# Patient Record
Sex: Female | Born: 1960 | Race: White | Hispanic: No | Marital: Married | Smoking: Current every day smoker
Health system: Southern US, Community
[De-identification: ages and names within clinical notes are randomized; demographics above are authoritative.]

## PROBLEM LIST (undated history)

## (undated) DIAGNOSIS — E079 Disorder of thyroid, unspecified: Secondary | ICD-10-CM

## (undated) DIAGNOSIS — K219 Gastro-esophageal reflux disease without esophagitis: Secondary | ICD-10-CM

## (undated) DIAGNOSIS — T7840XA Allergy, unspecified, initial encounter: Secondary | ICD-10-CM

## (undated) DIAGNOSIS — R011 Cardiac murmur, unspecified: Secondary | ICD-10-CM

## (undated) DIAGNOSIS — E785 Hyperlipidemia, unspecified: Secondary | ICD-10-CM

## (undated) DIAGNOSIS — G4730 Sleep apnea, unspecified: Secondary | ICD-10-CM

## (undated) DIAGNOSIS — M1990 Unspecified osteoarthritis, unspecified site: Secondary | ICD-10-CM

## (undated) DIAGNOSIS — I10 Essential (primary) hypertension: Secondary | ICD-10-CM

## (undated) HISTORY — DX: Hyperlipidemia, unspecified: E78.5

## (undated) HISTORY — DX: Gastro-esophageal reflux disease without esophagitis: K21.9

## (undated) HISTORY — DX: Sleep apnea, unspecified: G47.30

## (undated) HISTORY — DX: Disorder of thyroid, unspecified: E07.9

## (undated) HISTORY — DX: Cardiac murmur, unspecified: R01.1

## (undated) HISTORY — PX: LAPAROTOMY: SHX154

## (undated) HISTORY — DX: Allergy, unspecified, initial encounter: T78.40XA

## (undated) HISTORY — DX: Unspecified osteoarthritis, unspecified site: M19.90

## (undated) HISTORY — PX: NEPHRECTOMY: SHX65

## (undated) HISTORY — DX: Essential (primary) hypertension: I10

---

## 2010-10-29 ENCOUNTER — Other Ambulatory Visit: Payer: Self-pay | Admitting: Orthopedic Surgery

## 2010-10-29 DIAGNOSIS — M19071 Primary osteoarthritis, right ankle and foot: Secondary | ICD-10-CM

## 2010-11-03 ENCOUNTER — Ambulatory Visit
Admission: RE | Admit: 2010-11-03 | Discharge: 2010-11-03 | Disposition: A | Discharge status: Home / Self Care | Payer: PRIVATE HEALTH INSURANCE | Source: Ambulatory Visit | Attending: Orthopedic Surgery | Admitting: Orthopedic Surgery

## 2010-11-03 DIAGNOSIS — M19071 Primary osteoarthritis, right ankle and foot: Secondary | ICD-10-CM

## 2010-11-03 MED ORDER — IOHEXOL 180 MG/ML  SOLN: 1.0000 mL | Freq: Once | INTRAMUSCULAR | Status: AC | PRN

## 2010-11-03 MED ORDER — METHYLPREDNISOLONE ACETATE 40 MG/ML INJ SUSP (RADIOLOG: 120.0000 mg | Freq: Once | INTRAMUSCULAR | Status: DC

## 2015-08-04 DIAGNOSIS — E785 Hyperlipidemia, unspecified: Secondary | ICD-10-CM | POA: Insufficient documentation

## 2015-08-04 DIAGNOSIS — I10 Essential (primary) hypertension: Secondary | ICD-10-CM | POA: Insufficient documentation

## 2016-06-03 ENCOUNTER — Ambulatory Visit (INDEPENDENT_AMBULATORY_CARE_PROVIDER_SITE_OTHER): Payer: 59

## 2016-06-03 ENCOUNTER — Ambulatory Visit (INDEPENDENT_AMBULATORY_CARE_PROVIDER_SITE_OTHER): Payer: 59 | Admitting: Sports Medicine

## 2016-06-03 ENCOUNTER — Encounter: Payer: Self-pay | Admitting: Sports Medicine

## 2016-06-03 DIAGNOSIS — R52 Pain, unspecified: Secondary | ICD-10-CM

## 2016-06-03 DIAGNOSIS — M722 Plantar fascial fibromatosis: Secondary | ICD-10-CM

## 2016-06-03 MED ORDER — MELOXICAM 15 MG PO TABS: 15.0000 mg | ORAL_TABLET | Freq: Every day | ORAL | 0 refills | Status: DC

## 2016-06-03 MED ORDER — TRIAMCINOLONE ACETONIDE 10 MG/ML IJ SUSP: 10.0000 mg | Freq: Once | INTRAMUSCULAR | Status: DC

## 2016-06-03 MED ORDER — METHYLPREDNISOLONE 4 MG PO TBPK: ORAL_TABLET | ORAL | 0 refills | Status: DC

## 2016-06-03 NOTE — Progress Notes (Signed)
Subjective: Sandy Beck is a 56 y.o. female patient presents to office with complaint of heel pain on the left. Patient admits to post static dyskinesia for 3 months in duration. Patient has treated this problem with change of shoes, and Relafen with no relief. Denies any other pedal complaints.   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 Reports that the area just feels very bruised and sore with focal swelling.  There are no active problems to display for this patient.   No current outpatient prescriptions on file prior to visit.   No current facility-administered medications on file prior to visit.     Allergies  Allergen Reactions  . Sulfa Antibiotics     Objective: Physical Exam General: The patient is alert and oriented x3 in no acute distress.  Dermatology: Skin is warm, dry and supple bilateral lower extremities. Patient is very tan from sunbathing. Nails 1-10 are normal. There is no erythema, edema, no eccymosis, no open lesions present. Integument is otherwise unremarkable.  Vascular: Dorsalis Pedis pulse and Posterior Tibial pulse are 1/4 bilateral. Capillary fill time is immediate to all digits.  Neurological: Grossly intact to light touch with an achilles reflex of +2/5 and a  negative Tinel's sign bilateral.  Musculoskeletal: Tenderness to palpation at the medial calcaneal tubercale and through the insertion of the plantar fascia on the left foot. No pain with compression of calcaneus bilateral. No pain with tuning fork to calcaneus bilateral. No pain with calf compression bilateral. There is decreased Ankle joint range of motion bilateral. All other joints range of motion within normal limits bilateral. Strength 5/5 in all groups bilateral.   Gait: Unassisted, Antalgic avoid weight on left heel  Xray, Left foot:  Normal osseous mineralization. Joint spaces preserved. No fracture/dislocation/boney destruction. Calcaneal spur present  with mild thickening of plantar fascia. No other soft tissue abnormalities or radiopaque foreign bodies.   Assessment and Plan: Problem List Items Addressed This Visit    None    Visit Diagnoses    Plantar fasciitis of left foot    -  Primary   Relevant Medications   methylPREDNISolone (MEDROL DOSEPAK) 4 MG TBPK tablet   meloxicam (MOBIC) 15 MG tablet   triamcinolone acetonide (KENALOG) 10 MG/ML injection 10 mg   Pain       Relevant Orders   DG Foot Complete Left      -Complete examination performed.  -Xrays reviewed -Discussed with patient in detail the condition of plantar fasciitis, how this occurs and general treatment options. Explained both conservative and surgical treatments.  -After oral consent and aseptic prep, injected a mixture containing 1 ml of 2%  plain lidocaine, 1 ml 0.5% plain marcaine, 0.5 ml of kenalog 10 and 0.5 ml of dexamethasone phosphate into Left heel. Post-injection care discussed with patient.  -Rx Meloxicam to start after Medrol dose pack is completed -Recommended good supportive shoes -Advised patient to bring with her the custom insoles, She already owns for me to look at for possible modifications  Explained in detail the use of the fascial brace for the left which was dispensed at today's visit. -Explained and dispensed to patient daily stretching exercises. -Recommend patient to ice affected area 1-2x daily. -Patient to return to office in 3-4 weeks for follow up or sooner if problems or questions arise.  Landis Martins, DPM

## 2016-06-03 NOTE — Patient Instructions (Signed)

## 2016-06-25 ENCOUNTER — Ambulatory Visit (INDEPENDENT_AMBULATORY_CARE_PROVIDER_SITE_OTHER): Payer: 59 | Admitting: Sports Medicine

## 2016-06-25 DIAGNOSIS — M722 Plantar fascial fibromatosis: Secondary | ICD-10-CM | POA: Diagnosis not present

## 2016-06-25 DIAGNOSIS — R52 Pain, unspecified: Secondary | ICD-10-CM

## 2016-06-25 MED ORDER — METHYLPREDNISOLONE 4 MG PO TBPK: ORAL_TABLET | ORAL | 0 refills | Status: DC

## 2016-06-25 MED ORDER — MELOXICAM 15 MG PO TABS: 15.0000 mg | ORAL_TABLET | Freq: Every day | ORAL | 0 refills | Status: DC

## 2016-06-25 NOTE — Progress Notes (Signed)
Subjective: Sandy Beck is a 56 y.o. female returns to office for follow up evaluation after Left heel injection for plantar fasciitis, injection #1 administered 3 weeks ago. Patient states that the injection seems to help her pain for a few days and the steriod dose pak for 1 week only. States that the brace helps. Patient denies any recent changes in medications or new problems since last visit.   There are no active problems to display for this patient.   Current Outpatient Prescriptions on File Prior to Visit  Medication Sig Dispense Refill  . ALPRAZolam (XANAX) 0.25 MG tablet Take 0.25 mg by mouth daily.  0  . azithromycin (ZITHROMAX) 250 MG tablet TAKE 2 TABLETS BY MOUTH ON DAY 1, THEN TAKE 1 TABLET DAILY ON DAYS 2-5  0  . etodolac (LODINE) 500 MG tablet etodolac 500 mg tablet  Take 2 tablets every day by oral route for 90 days.    Marland Kitchen etodolac (LODINE) 500 MG tablet     . furosemide (LASIX) 20 MG tablet Take 20 mg by mouth daily.  0  . GUAIATUSSIN AC 100-10 MG/5ML syrup TAKE ONE TEASPOONFUL BY MOUTH TWICE DAILY  0  . levothyroxine (SYNTHROID, LEVOTHROID) 100 MCG tablet levothyroxine 100 mcg tablet  Take 1 tablet every day by oral route for 90 days.    Marland Kitchen levothyroxine (SYNTHROID, LEVOTHROID) 100 MCG tablet     . losartan-hydrochlorothiazide (HYZAAR) 100-25 MG tablet losartan 100 mg-hydrochlorothiazide 25 mg tablet  Take 1 tablet every day by oral route for 90 days.    . pravastatin (PRAVACHOL) 40 MG tablet pravastatin 40 mg tablet  Take 0.5 tablets every day by oral route for 90 days.    . traZODone (DESYREL) 150 MG tablet trazodone 150 mg tablet  Take 1 tablet every day by oral route for 90 days.     Current Facility-Administered Medications on File Prior to Visit  Medication Dose Route Frequency Provider Last Rate Last Dose  . triamcinolone acetonide (KENALOG) 10 MG/ML injection 10 mg  10 mg Other Once Landis Martins, DPM        Allergies  Allergen Reactions  . Sulfa  Antibiotics     Objective:   General:  Alert and oriented x 3, in no acute distress  Dermatology: Skin is warm, dry and supple bilateral lower extremities. Patient is very tan from sunbathing. Nails 1-10 are normal. There is no erythema, edema, no eccymosis, no open lesions present. Integument is otherwise unremarkable.  Vascular: Dorsalis Pedis pulse and Posterior Tibial pulse are 1/4 bilateral. Capillary fill time is immediate to all digits.  Neurological: Grossly intact to light touch with an achilles reflex of +2/5 and a  negative Tinel's sign bilateral.  Musculoskeletal: Tenderness to palpation at the medial calcaneal tubercale and through the insertion of the plantar fascia on the left foot. No pain with compression of calcaneus bilateral. No pain with tuning fork to calcaneus bilateral. No pain with calf compression bilateral. There is decreased Ankle joint range of motion bilateral. All other joints range of motion within normal limits bilateral. Strength 5/5 in all groups bilateral.   Assessment and Plan: Problem List Items Addressed This Visit    None    Visit Diagnoses    Pain    -  Primary   Plantar fasciitis of left foot       Relevant Medications   methylPREDNISolone (MEDROL DOSEPAK) 4 MG TBPK tablet   meloxicam (MOBIC) 15 MG tablet      -Complete examination  performed.  -Previous x-rays reviewed. -Discussed with patient in detail the condition of plantar fasciitis, how this  occurs related to the foot type of the patient and general treatment options. - Patient declined another injection today -Dispensed Left night splint -Refilled Medrol dosepak and mobic to take as instructed  -Continue with fascial brace, stretching, icing, good supportive shoes, OTC inserts daily.  -Discussed long term care and reocurrence; will closely monitor; if fails to improve will consider other treatment modalities.  -Patient to return to office in 3 weeks for follow up or sooner if  problems or questions arise.  Landis Martins, DPM

## 2016-07-23 ENCOUNTER — Ambulatory Visit: Payer: 59 | Admitting: Sports Medicine

## 2020-01-19 DIAGNOSIS — C801 Malignant (primary) neoplasm, unspecified: Secondary | ICD-10-CM

## 2020-01-19 HISTORY — DX: Malignant (primary) neoplasm, unspecified: C80.1

## 2020-04-25 DIAGNOSIS — C642 Malignant neoplasm of left kidney, except renal pelvis: Secondary | ICD-10-CM | POA: Insufficient documentation

## 2020-06-03 ENCOUNTER — Encounter: Payer: Self-pay | Admitting: Nurse Practitioner

## 2020-06-03 ENCOUNTER — Ambulatory Visit (INDEPENDENT_AMBULATORY_CARE_PROVIDER_SITE_OTHER): Payer: PRIVATE HEALTH INSURANCE | Admitting: Nurse Practitioner

## 2020-06-03 ENCOUNTER — Other Ambulatory Visit: Payer: Self-pay

## 2020-06-03 ENCOUNTER — Other Ambulatory Visit: Payer: Self-pay | Admitting: Nurse Practitioner

## 2020-06-03 VITALS — BP 152/88 | HR 77 | Temp 97.4°F | Ht 66.0 in | Wt 203.0 lb

## 2020-06-03 DIAGNOSIS — K219 Gastro-esophageal reflux disease without esophagitis: Secondary | ICD-10-CM

## 2020-06-03 DIAGNOSIS — I10 Essential (primary) hypertension: Secondary | ICD-10-CM

## 2020-06-03 DIAGNOSIS — E782 Mixed hyperlipidemia: Secondary | ICD-10-CM

## 2020-06-03 DIAGNOSIS — Z1231 Encounter for screening mammogram for malignant neoplasm of breast: Secondary | ICD-10-CM

## 2020-06-03 DIAGNOSIS — Z905 Acquired absence of kidney: Secondary | ICD-10-CM | POA: Diagnosis not present

## 2020-06-03 DIAGNOSIS — E039 Hypothyroidism, unspecified: Secondary | ICD-10-CM

## 2020-06-03 DIAGNOSIS — G4700 Insomnia, unspecified: Secondary | ICD-10-CM

## 2020-06-03 DIAGNOSIS — Z7689 Persons encountering health services in other specified circumstances: Secondary | ICD-10-CM

## 2020-06-03 DIAGNOSIS — Z85528 Personal history of other malignant neoplasm of kidney: Secondary | ICD-10-CM

## 2020-06-03 MED ORDER — LEVOTHYROXINE SODIUM 100 MCG PO TABS: 100.0000 ug | ORAL_TABLET | Freq: Every day | ORAL | 0 refills | Status: DC

## 2020-06-03 MED ORDER — PRAVASTATIN SODIUM 40 MG PO TABS: ORAL_TABLET | ORAL | 0 refills | Status: DC

## 2020-06-03 MED ORDER — LOSARTAN POTASSIUM-HCTZ 100-25 MG PO TABS: ORAL_TABLET | ORAL | 0 refills | Status: DC

## 2020-06-03 MED ORDER — TRAZODONE HCL 150 MG PO TABS: ORAL_TABLET | ORAL | 0 refills | Status: DC

## 2020-06-03 MED ORDER — OMEPRAZOLE 40 MG PO CPDR: 40.0000 mg | DELAYED_RELEASE_CAPSULE | Freq: Every day | ORAL | 0 refills | Status: DC

## 2020-06-03 NOTE — Progress Notes (Signed)
New Patient Office Visit  Subjective:  Patient ID: Sandy Beck, female    DOB: July 08, 1960  Age: 60 y.o. MRN: [id number]  CC: S/P  Nephrectomy  HPI Sandy Beck is a 60 year old Caucasian female that presents for her initial visit to the office to establish primary care provider. She has chronic medical conditions that include hypertension, hyperlipidemia, GERD, hypothyroidism, and osteoarthritis. She was recently diagnosed with left renal carcinoma and underwent left nephrectomy on 04/25/20 with Dr Vernard Gambles in [facility]. She states she is doing well post-nephrectomy. States she is experiencing some fatigue but she is stronger every day.  Hypertension Sandy Beck has a past medical history of hypertension for several years. Current treatment includes Hyzaar 100-25 mg daily. BP 152/88 in-office today. She tells me she was instructed to hold antihypertensive medications after experiencing hypotension post left nephrectomy. She denies chest pain, dyspnea, headaches, or dizziness. She consumes a heart healthy diet and is physically active.   Hyperlipidemia Sandy Beck has a past medical history of hyperlipidemia for several years. Current treatment includes Pravastatin 40 mg daily. Last lipid panel data pending receiving medical records from previous provider. Sandy Beck has agreed to return to the office for fasting labs. She denies myalgias or arthralgias related to statin therapy.   GERD Sandy Beck has past medical history of GERD for several years. Current treatment includes Omeprazole 40 mg daily. States symptoms are currently well-controlled. She has a past medical history of gastritis with esophageal narrowing. States she underwent EGD with esophageal dilation with Dr Lyda Jester a few years ago. She denies current difficulty swallowing. She is due for screening colonoscopy.    Hypothyroidism        Sandy Beck has a past medical history of hypothyroidism. Current treatment is Levothyroxine 100 mcg daily.  Well-controlled per last labs on 04/22/20 with        TSH 2.05, Free T4 1.28, and T3 total 99. She denies current hypothyroid symptoms.     Social History   Socioeconomic History  . Marital status: Married    Spouse name: Not on file  . Number of children: Not on file  . Years of education: Not on file  . Highest education level: Not on file  Occupational History  . Not on file  Tobacco Use  . Smoking status: Unknown If Ever Smoked  . Smokeless tobacco: Never Used  Substance and Sexual Activity  . Alcohol use: Not on file  . Drug use: Not on file  . Sexual activity: Not on file  Other Topics Concern  . Not on file  Social History Narrative  . Not on file      ROS Review of Systems  Constitutional: Positive for fatigue. Negative for appetite change and unexpected weight change.  HENT: Negative for congestion, ear pain, rhinorrhea, sinus pressure, sinus pain and tinnitus.   Eyes: Negative for pain.  Respiratory: Negative for cough and shortness of breath.   Cardiovascular: Negative for chest pain, palpitations and leg swelling.  Gastrointestinal: Negative for abdominal pain, constipation, diarrhea, nausea and vomiting.  Endocrine: Negative for cold intolerance, heat intolerance, polydipsia, polyphagia and polyuria.  Genitourinary: Negative for dysuria, frequency and hematuria.  Musculoskeletal: Positive for arthralgias (osteoarthritis). Negative for back pain, joint swelling and myalgias.  Skin: Negative for rash.  Allergic/Immunologic: Positive for environmental allergies.  Neurological: Negative for dizziness and headaches.  Hematological: Negative for adenopathy.  Psychiatric/Behavioral: Positive for sleep disturbance. Negative for decreased concentration. The patient is not nervous/anxious.     Objective:   Today's Vitals:  BP (!) 152/88 (BP Location: Left Arm, Patient Position: Sitting)   Pulse 77   Temp (!) 97.4 F (36.3 C) (Temporal)   Ht 5\' 6"  (1.676 m)   Wt  203 lb (92.1 kg)   SpO2 97%   BMI 32.77 kg/m   Physical Exam Vitals reviewed.  Constitutional:      Appearance: Normal appearance.  HENT:     Head: Normocephalic.     Right Ear: Tympanic membrane normal.     Left Ear: Tympanic membrane normal.     Nose: Nose normal.     Mouth/Throat:     Mouth: Mucous membranes are moist.  Eyes:     Pupils: Pupils are equal, round, and reactive to light.  Cardiovascular:     Rate and Rhythm: Normal rate and regular rhythm.     Pulses: Normal pulses.     Heart sounds: Normal heart sounds.  Pulmonary:     Effort: Pulmonary effort is normal.     Breath sounds: Normal breath sounds.  Abdominal:     General: Bowel sounds are normal.     Palpations: Abdomen is soft.  Musculoskeletal:        General: Normal range of motion.     Cervical back: Neck supple.  Skin:    General: Skin is warm and dry.     Capillary Refill: Capillary refill takes less than 2 seconds.  Neurological:     General: No focal deficit present.     Mental Status: She is alert and oriented to person, place, and time.  Psychiatric:        Mood and Affect: Mood normal.        Behavior: Behavior normal.     Assessment & Plan:   1. Essential (primary) hypertension-not at goal -Continue Hyzaar -Continue heart healthy diet -Continue physical activity  2. Mixed hyperlipidemia-pending labs -Continue Pravastatin 40 mg daily  3. Gastro-esophageal reflux disease without esophagitis-well controlled -Continue Prilosec 40 mg daily  4. S/p nephrectomy -Continue follow-up with Dr Vernard Gambles as scheduled  5. Personal history of renal cancer  6. Encounter for screening mammogram for malignant neoplasm of breast - [facility] DIGITAL SCREENING BILATERAL  Return on June 27th for fasting blood work and mammogram    Outpatient Encounter Medications as of 06/03/2020  Medication Sig  . levothyroxine (SYNTHROID, LEVOTHROID) 100 MCG tablet   . losartan-hydrochlorothiazide (HYZAAR) 100-25 MG  tablet losartan 100 mg-hydrochlorothiazide 25 mg tablet  Take 1 tablet every day by oral route for 90 days.  Marland Kitchen omeprazole (PRILOSEC) 40 MG capsule Take 40 mg by mouth daily.  . pravastatin (PRAVACHOL) 40 MG tablet pravastatin 40 mg tablet  Take 0.5 tablets every day by oral route for 90 days.  . traZODone (DESYREL) 150 MG tablet trazodone 150 mg tablet  Take 1 tablet every day by oral route for 90 days.  . [DISCONTINUED] ALPRAZolam (XANAX) 0.25 MG tablet Take 0.25 mg by mouth daily.  . [DISCONTINUED] azithromycin (ZITHROMAX) 250 MG tablet TAKE 2 TABLETS BY MOUTH ON DAY 1, THEN TAKE 1 TABLET DAILY ON DAYS 2-5  . [DISCONTINUED] etodolac (LODINE) 500 MG tablet etodolac 500 mg tablet  Take 2 tablets every day by oral route for 90 days.  . [DISCONTINUED] etodolac (LODINE) 500 MG tablet   . [DISCONTINUED] furosemide (LASIX) 20 MG tablet Take 20 mg by mouth daily.  . [DISCONTINUED] GUAIATUSSIN AC 100-10 MG/5ML syrup TAKE ONE TEASPOONFUL BY MOUTH TWICE DAILY  . [DISCONTINUED] levothyroxine (SYNTHROID, LEVOTHROID) 100 MCG tablet levothyroxine 100  mcg tablet  Take 1 tablet every day by oral route for 90 days.  . [DISCONTINUED] meloxicam (MOBIC) 15 MG tablet Take 1 tablet (15 mg total) by mouth daily.  . [DISCONTINUED] methylPREDNISolone (MEDROL DOSEPAK) 4 MG TBPK tablet Take as instructed  . [DISCONTINUED] triamcinolone acetonide (KENALOG) 10 MG/ML injection 10 mg    No facility-administered encounter medications on file as of 06/03/2020.    Follow-up: 07/14/20 for fasting labs and mammogram; 57-month follow-up with pap  Signed, Rip Harbour, NP

## 2020-06-03 NOTE — Patient Instructions (Addendum)
Return on June 27th for fasting blood work and mammogram   Preventive Care 62-62 Years Old, Female Preventive care refers to lifestyle choices and visits with your health care provider that can promote health and wellness. This includes:  A yearly physical exam. This is also called an annual wellness visit.  Regular dental and eye exams.  Immunizations.  Screening for certain conditions.  Healthy lifestyle choices, such as: ? Eating a healthy diet. ? Getting regular exercise. ? Not using drugs or products that contain nicotine and tobacco. ? Limiting alcohol use. What can I expect for my preventive care visit? Physical exam Your health care provider will check your:  Height and weight. These may be used to calculate your BMI (body mass index). BMI is a measurement that tells if you are at a healthy weight.  Heart rate and blood pressure.  Body temperature.  Skin for abnormal spots. Counseling Your health care provider may ask you questions about your:  Past medical problems.  Family's medical history.  Alcohol, tobacco, and drug use.  Emotional well-being.  Home life and relationship well-being.  Sexual activity.  Diet, exercise, and sleep habits.  Work and work Statistician.  Access to firearms.  Method of birth control.  Menstrual cycle.  Pregnancy history. What immunizations do I need? Vaccines are usually given at various ages, according to a schedule. Your health care provider will recommend vaccines for you based on your age, medical history, and lifestyle or other factors, such as travel or where you work.   What tests do I need? Blood tests  Lipid and cholesterol levels. These may be checked every 5 years, or more often if you are over 18 years old.  Hepatitis C test.  Hepatitis B test. Screening  Lung cancer screening. You may have this screening every year starting at age 37 if you have a 30-pack-year history of smoking and currently smoke  or have quit within the past 15 years.  Colorectal cancer screening. ? All adults should have this screening starting at age 73 and continuing until age 52. ? Your health care provider may recommend screening at age 17 if you are at increased risk. ? You will have tests every 1-10 years, depending on your results and the type of screening test.  Diabetes screening. ? This is done by checking your blood sugar (glucose) after you have not eaten for a while (fasting). ? You may have this done every 1-3 years.  Mammogram. ? This may be done every 1-2 years. ? Talk with your health care provider about when you should start having regular mammograms. This may depend on whether you have a family history of breast cancer.  BRCA-related cancer screening. This may be done if you have a family history of breast, ovarian, tubal, or peritoneal cancers.  Pelvic exam and Pap test. ? This may be done every 3 years starting at age 53. ? Starting at age 26, this may be done every 5 years if you have a Pap test in combination with an HPV test. Other tests  STD (sexually transmitted disease) testing, if you are at risk.  Bone density scan. This is done to screen for osteoporosis. You may have this scan if you are at high risk for osteoporosis. Talk with your health care provider about your test results, treatment options, and if necessary, the need for more tests. Follow these instructions at home: Eating and drinking  Eat a diet that includes fresh fruits and vegetables, whole grains,  lean protein, and low-fat dairy products.  Take vitamin and mineral supplements as recommended by your health care provider.  Do not drink alcohol if: ? Your health care provider tells you not to drink. ? You are pregnant, may be pregnant, or are planning to become pregnant.  If you drink alcohol: ? Limit how much you have to 0-1 drink a day. ? Be aware of how much alcohol is in your drink. In the [place] one drink  equals one 12 oz bottle of beer (355 mL), one 5 oz glass of wine (148 mL), or one 1 oz glass of hard liquor (44 mL).   Lifestyle  Take daily care of your teeth and gums. Brush your teeth every morning and night with fluoride toothpaste. Floss one time each day.  Stay active. Exercise for at least 30 minutes 5 or more days each week.  Do not use any products that contain nicotine or tobacco, such as cigarettes, e-cigarettes, and chewing tobacco. If you need help quitting, ask your health care provider.  Do not use drugs.  If you are sexually active, practice safe sex. Use a condom or other form of protection to prevent STIs (sexually transmitted infections).  If you do not wish to become pregnant, use a form of birth control. If you plan to become pregnant, see your health care provider for a prepregnancy visit.  If told by your health care provider, take low-dose aspirin daily starting at age 31.  Find healthy ways to cope with stress, such as: ? Meditation, yoga, or listening to music. ? Journaling. ? Talking to a trusted person. ? Spending time with friends and family. Safety  Always wear your seat belt while driving or riding in a vehicle.  Do not drive: ? If you have been drinking alcohol. Do not ride with someone who has been drinking. ? When you are tired or distracted. ? While texting.  Wear a helmet and other protective equipment during sports activities.  If you have firearms in your house, make sure you follow all gun safety procedures. What's next?  Visit your health care provider once a year for an annual wellness visit.  Ask your health care provider how often you should have your eyes and teeth checked.  Stay up to date on all vaccines. This information is not intended to replace advice given to you by your health care provider. Make sure you discuss any questions you have with your health care provider. Document Revised: 10/09/2019 Document Reviewed:  09/15/2017 Elsevier Patient Education  2021 [place] Maintenance, Female Adopting a healthy lifestyle and getting preventive care are important in promoting health and wellness. Ask your health care provider about:  The right schedule for you to have regular tests and exams.  Things you can do on your own to prevent diseases and keep yourself healthy. What should I know about diet, weight, and exercise? Eat a healthy diet  Eat a diet that includes plenty of vegetables, fruits, low-fat dairy products, and lean protein.  Do not eat a lot of foods that are high in solid fats, added sugars, or sodium.   Maintain a healthy weight Body mass index (BMI) is used to identify weight problems. It estimates body fat based on height and weight. Your health care provider can help determine your BMI and help you achieve or maintain a healthy weight. Get regular exercise Get regular exercise. This is one of the most important things you can do for your health. Most  adults should:  Exercise for at least 150 minutes each week. The exercise should increase your heart rate and make you sweat (moderate-intensity exercise).  Do strengthening exercises at least twice a week. This is in addition to the moderate-intensity exercise.  Spend less time sitting. Even light physical activity can be beneficial. Watch cholesterol and blood lipids Have your blood tested for lipids and cholesterol at 62 years of age, then have this test every 5 years. Have your cholesterol levels checked more often if:  Your lipid or cholesterol levels are high.  You are older than 62 years of age.  You are at high risk for heart disease. What should I know about cancer screening? Depending on your health history and family history, you may need to have cancer screening at various ages. This may include screening for:  Breast cancer.  Cervical cancer.  Colorectal cancer.  Skin cancer.  Lung cancer. What should  I know about heart disease, diabetes, and high blood pressure? Blood pressure and heart disease  High blood pressure causes heart disease and increases the risk of stroke. This is more likely to develop in people who have high blood pressure readings, are of African descent, or are overweight.  Have your blood pressure checked: ? Every 3-5 years if you are 39-39 years of age. ? Every year if you are 68 years old or older. Diabetes Have regular diabetes screenings. This checks your fasting blood sugar level. Have the screening done:  Once every three years after age 39 if you are at a normal weight and have a low risk for diabetes.  More often and at a younger age if you are overweight or have a high risk for diabetes. What should I know about preventing infection? Hepatitis B If you have a higher risk for hepatitis B, you should be screened for this virus. Talk with your health care provider to find out if you are at risk for hepatitis B infection. Hepatitis C Testing is recommended for:  Everyone born from [birth year] through 1967.  Anyone with known risk factors for hepatitis C. Sexually transmitted infections (STIs)  Get screened for STIs, including gonorrhea and chlamydia, if: ? You are sexually active and are younger than 62 years of age. ? You are older than 62 years of age and your health care provider tells you that you are at risk for this type of infection. ? Your sexual activity has changed since you were last screened, and you are at increased risk for chlamydia or gonorrhea. Ask your health care provider if you are at risk.  Ask your health care provider about whether you are at high risk for HIV. Your health care provider may recommend a prescription medicine to help prevent HIV infection. If you choose to take medicine to prevent HIV, you should first get tested for HIV. You should then be tested every 3 months for as long as you are taking the medicine. Pregnancy  If you are  about to stop having your period (premenopausal) and you may become pregnant, seek counseling before you get pregnant.  Take 400 to 800 micrograms (mcg) of folic acid every day if you become pregnant.  Ask for birth control (contraception) if you want to prevent pregnancy. Osteoporosis and menopause Osteoporosis is a disease in which the bones lose minerals and strength with aging. This can result in bone fractures. If you are 44 years old or older, or if you are at risk for osteoporosis and fractures, ask your  health care provider if you should:  Be screened for bone loss.  Take a calcium or vitamin D supplement to lower your risk of fractures.  Be given hormone replacement therapy (HRT) to treat symptoms of menopause. Follow these instructions at home: Lifestyle  Do not use any products that contain nicotine or tobacco, such as cigarettes, e-cigarettes, and chewing tobacco. If you need help quitting, ask your health care provider.  Do not use street drugs.  Do not share needles.  Ask your health care provider for help if you need support or information about quitting drugs. Alcohol use  Do not drink alcohol if: ? Your health care provider tells you not to drink. ? You are pregnant, may be pregnant, or are planning to become pregnant.  If you drink alcohol: ? Limit how much you use to 0-1 drink a day. ? Limit intake if you are breastfeeding.  Be aware of how much alcohol is in your drink. In the [place] one drink equals one 12 oz bottle of beer (355 mL), one 5 oz glass of wine (148 mL), or one 1 oz glass of hard liquor (44 mL). General instructions  Schedule regular health, dental, and eye exams.  Stay current with your vaccines.  Tell your health care provider if: ? You often feel depressed. ? You have ever been abused or do not feel safe at home. Summary  Adopting a healthy lifestyle and getting preventive care are important in promoting health and wellness.  Follow  your health care provider's instructions about healthy diet, exercising, and getting tested or screened for diseases.  Follow your health care provider's instructions on monitoring your cholesterol and blood pressure. This information is not intended to replace advice given to you by your health care provider. Make sure you discuss any questions you have with your health care provider. Document Revised: 12/28/2017 Document Reviewed: 12/28/2017 Elsevier Patient Education  2021 [employer].

## 2020-08-10 ENCOUNTER — Other Ambulatory Visit: Payer: Self-pay | Admitting: Nurse Practitioner

## 2020-08-10 DIAGNOSIS — G4700 Insomnia, unspecified: Secondary | ICD-10-CM

## 2020-08-10 DIAGNOSIS — K219 Gastro-esophageal reflux disease without esophagitis: Secondary | ICD-10-CM

## 2020-08-10 DIAGNOSIS — I10 Essential (primary) hypertension: Secondary | ICD-10-CM

## 2020-08-10 DIAGNOSIS — E039 Hypothyroidism, unspecified: Secondary | ICD-10-CM

## 2020-09-04 ENCOUNTER — Ambulatory Visit (INDEPENDENT_AMBULATORY_CARE_PROVIDER_SITE_OTHER): Payer: PRIVATE HEALTH INSURANCE | Admitting: Nurse Practitioner

## 2020-09-04 ENCOUNTER — Other Ambulatory Visit: Payer: Self-pay | Admitting: Nurse Practitioner

## 2020-09-04 ENCOUNTER — Other Ambulatory Visit: Payer: Self-pay

## 2020-09-04 ENCOUNTER — Encounter: Payer: Self-pay | Admitting: Nurse Practitioner

## 2020-09-04 VITALS — BP 140/78 | HR 66 | Temp 97.9°F | Ht 66.0 in | Wt 203.0 lb

## 2020-09-04 DIAGNOSIS — G4700 Insomnia, unspecified: Secondary | ICD-10-CM

## 2020-09-04 DIAGNOSIS — E782 Mixed hyperlipidemia: Secondary | ICD-10-CM

## 2020-09-04 DIAGNOSIS — K219 Gastro-esophageal reflux disease without esophagitis: Secondary | ICD-10-CM

## 2020-09-04 DIAGNOSIS — I10 Essential (primary) hypertension: Secondary | ICD-10-CM

## 2020-09-04 DIAGNOSIS — Z1231 Encounter for screening mammogram for malignant neoplasm of breast: Secondary | ICD-10-CM

## 2020-09-04 DIAGNOSIS — E039 Hypothyroidism, unspecified: Secondary | ICD-10-CM

## 2020-09-04 DIAGNOSIS — B3731 Acute candidiasis of vulva and vagina: Secondary | ICD-10-CM

## 2020-09-04 DIAGNOSIS — E6609 Other obesity due to excess calories: Secondary | ICD-10-CM

## 2020-09-04 DIAGNOSIS — R5383 Other fatigue: Secondary | ICD-10-CM

## 2020-09-04 DIAGNOSIS — B373 Candidiasis of vulva and vagina: Secondary | ICD-10-CM

## 2020-09-04 DIAGNOSIS — F17218 Nicotine dependence, cigarettes, with other nicotine-induced disorders: Secondary | ICD-10-CM

## 2020-09-04 DIAGNOSIS — Z6832 Body mass index (BMI) 32.0-32.9, adult: Secondary | ICD-10-CM

## 2020-09-04 MED ORDER — LEVOTHYROXINE SODIUM 100 MCG PO TABS: 100.0000 ug | ORAL_TABLET | Freq: Every day | ORAL | 3 refills | Status: DC

## 2020-09-04 MED ORDER — FLUCONAZOLE 150 MG PO TABS: 150.0000 mg | ORAL_TABLET | Freq: Once | ORAL | 0 refills | Status: AC

## 2020-09-04 MED ORDER — LOSARTAN POTASSIUM-HCTZ 100-25 MG PO TABS: ORAL_TABLET | ORAL | 3 refills | Status: DC

## 2020-09-04 MED ORDER — PRAVASTATIN SODIUM 40 MG PO TABS: ORAL_TABLET | ORAL | 0 refills | Status: DC

## 2020-09-04 MED ORDER — OMEPRAZOLE 40 MG PO CPDR: 40.0000 mg | DELAYED_RELEASE_CAPSULE | Freq: Every day | ORAL | 3 refills | Status: DC

## 2020-09-04 MED ORDER — TRAZODONE HCL 150 MG PO TABS: ORAL_TABLET | ORAL | 3 refills | Status: DC

## 2020-09-04 NOTE — Progress Notes (Signed)
Subjective:  Patient ID: Sandy Beck, female    DOB: 1960-10-03  Age: 60 y.o. MRN: [id number]  Chief Complaint  Patient presents with   Hypertension   Hyperlipidemia   Hypothyroidism    HPI: Sandy Beck is a 60 year old Caucasian female that presents for follow-up of hypertension, hyperlipidemia, and hypothyroidism. She tells me she was recently treated for a UTI with a course of antibiotics and has developed a vaginal yeast infection. Sandy Beck inadvertently missed scheduled mammogram due to her father having a CVA. She has requested to reschedule for Sept. She is looking forward to a long-distance drive to [place] to meet her father-in-law next week.  She was last seen for hypertension 3 months ago.  BP at that visit was 152/88. Management since that visit includes Hyzaar 100-25.  She reports good compliance with treatment. She is not having side effects.  She is following a Regular diet. She is not exercising. She does smoke.  Use of agents associated with hypertension: thyroid hormones.   Outside blood pressures are not being checked. Symptoms: No chest pain No chest pressure  No palpitations No syncope  No dyspnea No orthopnea  No paroxysmal nocturnal dyspnea No lower extremity edema   Pertinent labs: Lab Results  Component Value Date   CHOL 187 09/04/2020   HDL 44 09/04/2020   LDLCALC 105 (H) 09/04/2020   TRIG 222 (H) 09/04/2020   CHOLHDL 4.3 09/04/2020   Lab Results  Component Value Date   NA 138 09/04/2020   K 4.2 09/04/2020   CREATININE 1.26 (H) 09/04/2020   GLUCOSE 122 (H) 09/04/2020     The 10-year ASCVD risk score Mikey Bussing DC Jr., et al., 2013) is: 12.8%    Lipid/Cholesterol, Follow-up  Last lipid panel Other pertinent labs  Lab Results  Component Value Date   CHOL 187 09/04/2020   HDL 44 09/04/2020   LDLCALC 105 (H) 09/04/2020   TRIG 222 (H) 09/04/2020   CHOLHDL 4.3 09/04/2020   Lab Results  Component Value Date   ALT 13 09/04/2020   AST 19  09/04/2020   PLT 270 09/04/2020   TSH 2.510 09/04/2020     She was last seen for this 3 months ago.  Management since that visit includes Pravachol 40 mg daily.  She reports good compliance with treatment. She is not having side effects.   Symptoms: No chest pain No chest pressure/discomfort  No dyspnea No lower extremity edema  No numbness or tingling of extremity No orthopnea  No palpitations No paroxysmal nocturnal dyspnea  No speech difficulty No syncope   Current diet: well balanced Current exercise: none  The 10-year ASCVD risk score Mikey Bussing DC Jr., et al., 2013) is: 12.8%   GERD, Follow up:  The patient was last seen for GERD 3 months ago. Current treatment Omeprazole 40 mg daily.  She reports excellent compliance with treatment. She is not having side effects. . She is NOT experiencing belching and eructation   Hypothyroidism, follow-up: Sandy Beck has a past medical history of hypothyroidism for several years. Current treatment is Levothyroxine 100 mcg daily. Last TSH stable at 2.05 on 04/22/20. States she is experiencing fatigue.      Current Outpatient Medications on File Prior to Visit  Medication Sig Dispense Refill   levothyroxine (SYNTHROID) 100 MCG tablet TAKE 1 TABLET BY MOUTH  DAILY BEFORE BREAKFAST 90 tablet 3   losartan-hydrochlorothiazide (HYZAAR) 100-25 MG tablet TAKE 1 TABLET BY MOUTH  DAILY 90 tablet 3   omeprazole (PRILOSEC) 40 MG  capsule TAKE 1 CAPSULE BY MOUTH  DAILY 90 capsule 3   pravastatin (PRAVACHOL) 40 MG tablet pravastatin 40 mg tablet  Take 0.5 tablets every day by oral route for 90 days. 90 tablet 0   traZODone (DESYREL) 150 MG tablet TAKE 1 TABLET BY MOUTH  DAILY 90 tablet 3   No current facility-administered medications on file prior to visit.   Past Medical History:  Diagnosis Date   Allergy    Arthritis    Cancer ([place]) 2022   left renal cancer, had left nephrectomy   GERD (gastroesophageal reflux disease)    Heart murmur     Hyperlipidemia    Hypertension    Sleep apnea    not currently using cpap   Thyroid disease    Past Surgical History:  Procedure Laterality Date   LAPAROTOMY  1980s   tubal pregnancy, left fallopian tube removal   NEPHRECTOMY Left     Family History  Problem Relation Age of Onset   Alzheimer's disease Mother    Diabetes Mellitus II Mother    Hypertension Father    Stroke Sister    Hypertension Sister    Social History   Socioeconomic History   Marital status: Married    Spouse name: Not on file   Number of children: 1   Years of education: Not on file   Highest education level: Not on file  Occupational History   Occupation: Scientist, water quality  Tobacco Use   Smoking status: Every Day    Packs/day: 0.50    Years: 45.00    Pack years: 22.50    Types: Cigarettes   Smokeless tobacco: Never  Vaping Use   Vaping Use: Never used  Substance and Sexual Activity   Alcohol use: Not Currently   Drug use: Never   Sexual activity: Not on file  Other Topics Concern   Not on file  Social History Narrative   Not on file   Social Determinants of Health   Financial Resource Strain: Not on file  Food Insecurity: Not on file  Transportation Needs: Not on file  Physical Activity: Not on file  Stress: Not on file  Social Connections: Not on file    Review of Systems  Constitutional:  Negative for appetite change, fatigue and fever.  HENT:  Negative for congestion, ear pain, sinus pressure and sore throat.   Eyes:  Negative for pain.  Respiratory:  Negative for cough, chest tightness, shortness of breath and wheezing.   Cardiovascular:  Negative for chest pain and palpitations.  Gastrointestinal:  Negative for abdominal pain, constipation, diarrhea, nausea and vomiting.  Endocrine: Negative.   Genitourinary:  Positive for vaginal discharge. Negative for dysuria and hematuria.  Musculoskeletal:  Positive for arthralgias (chronic right foot pain). Negative for back pain, joint swelling  and myalgias.  Skin:  Negative for rash.  Allergic/Immunologic: Negative.   Neurological:  Negative for dizziness, weakness and headaches.  Hematological: Negative.   Psychiatric/Behavioral:  Negative for dysphoric mood. The patient is not nervous/anxious.     Objective:  BP 140/78 (BP Location: Left Arm, Patient Position: Sitting)   Pulse 66   Temp 97.9 F (36.6 C) (Temporal)   Ht '5\' 6"'$  (1.676 m)   Wt 203 lb (92.1 kg)   SpO2 95%   BMI 32.77 kg/m   BP/Weight 09/04/2020 [id number]  Systolic BP [id number]  Diastolic BP 78 88  Wt. (Lbs) 203 203  BMI 32.77 32.77    Physical Exam Vitals reviewed.  Constitutional:  Appearance: Normal appearance.  HENT:     Right Ear: Tympanic membrane, ear canal and external ear normal.     Left Ear: Tympanic membrane, ear canal and external ear normal.     Nose: Nose normal.     Mouth/Throat:     Mouth: Mucous membranes are moist.  Cardiovascular:     Rate and Rhythm: Normal rate and regular rhythm.     Pulses: Normal pulses.     Heart sounds: Normal heart sounds.  Pulmonary:     Effort: Pulmonary effort is normal.     Breath sounds: Normal breath sounds.  Abdominal:     Palpations: Abdomen is soft.  Musculoskeletal:        General: Normal range of motion.     Cervical back: Normal range of motion.  Skin:    General: Skin is warm and dry.  Neurological:     Mental Status: She is alert and oriented to person, place, and time.  Psychiatric:        Mood and Affect: Mood normal.        Behavior: Behavior normal.        Thought Content: Thought content normal.        Judgment: Judgment normal.            Assessment & Plan:    1. Essential (primary) hypertension-not at goal  - CBC With Diff/Platelet - Comprehensive metabolic panel -Continue Hyzaar 100-25 daily  -Heart healthy diet -Increase physical activity  2. Mixed hyperlipidemia-not at goal - Lipid panel -Continue Pravastatin 40 mg daily  3. Acquired  hypothyroidism-well controlled - TSH -Continue Levothyroxine 100 mcg daily  4. Gastro-esophageal reflux disease without esophagitis-well controlled -Continue Prilosec 40 mg daily -Avoid foods that trigger GERD  5. Other fatigue - Vitamin D, 25-hydroxy  6. Vaginal yeast infection - fluconazole (DIFLUCAN) 150 MG tablet; Take 1 tablet (150 mg total) by mouth once for 1 dose.  Dispense: 2 tablet; Refill: 0   We will call you with lab results and mammogram appointment for Monday, September 26th, 2022 Recommend routine eye exam Continue medications Continue heart healthy diet Continue physical activity Notify office if you want a steroid injection to right foot Follow-up in 60-month, fasting    Follow-up: 656-month An After Visit Summary was printed and given to the patient.   I, ShRip HarbourNP, have reviewed all documentation for this visit. The documentation on 09/05/20 for the exam, diagnosis, procedures, and orders are all accurate and complete.    I,Lauren M Auman,acting as a scEducation administratoror ShCIT GroupNP.,have documented all relevant documentation on the behalf of ShRip HarbourNP,as directed by  ShRip HarbourNP while in the presence of ShRip HarbourNP.   Signed, ShRip HarbourNP [phone]

## 2020-09-04 NOTE — Patient Instructions (Addendum)
We will call you with lab results and mammogram appointment for Monday, September 26th, 2022 Recommend routine eye exam Continue medications Continue heart healthy diet Continue physical activity Notify office if you want a steroid injection to right foot Follow-up in 13-month, fasting   Foot Pain Many things can cause foot pain. Some common causes are: An injury. A sprain. Arthritis. Blisters. Bunions. Follow these instructions at home: Managing pain, stiffness, and swelling If directed, put ice on the painful area: Put ice in a plastic bag. Place a towel between your skin and the bag. Leave the ice on for 20 minutes, 2-3 times a day.  Activity Do not stand or walk for long periods. Return to your normal activities as told by your health care provider. Ask your health care provider what activities are safe for you. Do stretches to relieve foot pain and stiffness as told by your health care provider. Do not lift anything that is heavier than 10 lb (4.5 kg), or the limit that you are told, until your health care provider says that it is safe. Lifting a lot of weight can put added pressure on your feet. Lifestyle Wear comfortable, supportive shoes that fit you well. Do not wear high heels. Keep your feet clean and dry. General instructions Take over-the-counter and prescription medicines only as told by your health care provider. Rub your foot gently. Pay attention to any changes in your symptoms. Keep all follow-up visits as told by your health care provider. This is important. Contact a health care provider if: Your pain does not get better after a few days of self-care. Your pain gets worse. You cannot stand on your foot. Get help right away if: Your foot is numb or tingling. Your foot or toes are swollen. Your foot or toes turn white or blue. You have warmth and redness along your foot. Summary Common causes of foot pain are injury, sprain, arthritis, blisters or  bunions. Ice, medicines, and comfortable shoes may help foot pain. Contact your health care provider if your pain does not get better after a few days of self-care. This information is not intended to replace advice given to you by your health care provider. Make sure you discuss any questions you have with your healthcare provider. Document Revised: 10/20/2017 Document Reviewed: 10/20/2017 Elsevier Patient Education  2022 [employer]   Preventive Care [id number] Old, Female Preventive care refers to lifestyle choices and visits with your health care provider that can promote health and wellness. This includes: A yearly physical exam. This is also called an annual wellness visit. Regular dental and eye exams. Immunizations. Screening for certain conditions. Healthy lifestyle choices, such as: Eating a healthy diet. Getting regular exercise. Not using drugs or products that contain nicotine and tobacco. Limiting alcohol use. What can I expect for my preventive care visit? Physical exam Your health care provider will check your: Height and weight. These may be used to calculate your BMI (body mass index). BMI is a measurement that tells if you are at a healthy weight. Heart rate and blood pressure. Body temperature. Skin for abnormal spots. Counseling Your health care provider may ask you questions about your: Past medical problems. Family's medical history. Alcohol, tobacco, and drug use. Emotional well-being. Home life and relationship well-being. Sexual activity. Diet, exercise, and sleep habits. Work and work [employer] Access to firearms. Method of birth control. Menstrual cycle. Pregnancy history. What immunizations do I need?  Vaccines are usually given at various ages, according to a  schedule. Your health care provider will recommend vaccines for you based on your age, medicalhistory, and lifestyle or other factors, such as travel or where you work. What tests  do I need? Blood tests Lipid and cholesterol levels. These may be checked every 5 years, or more often if you are over 62 years old. Hepatitis C test. Hepatitis B test. Screening Lung cancer screening. You may have this screening every year starting at age 62 if you have a 30-pack-year history of smoking and currently smoke or have quit within the past 15 years. Colorectal cancer screening. All adults should have this screening starting at age 62 and continuing until age 50. Your health care provider may recommend screening at age 4 if you are at increased risk. You will have tests every 1-10 years, depending on your results and the type of screening test. Diabetes screening. This is done by checking your blood sugar (glucose) after you have not eaten for a while (fasting). You may have this done every 1-3 years. Mammogram. This may be done every 1-2 years. Talk with your health care provider about when you should start having regular mammograms. This may depend on whether you have a family history of breast cancer. BRCA-related cancer screening. This may be done if you have a family history of breast, ovarian, tubal, or peritoneal cancers. Pelvic exam and Pap test. This may be done every 3 years starting at age 62. Starting at age 62, this may be done every 5 years if you have a Pap test in combination with an HPV test. Other tests STD (sexually transmitted disease) testing, if you are at risk. Bone density scan. This is done to screen for osteoporosis. You may have this scan if you are at high risk for osteoporosis. Talk with your health care provider about your test results, treatment options,and if necessary, the need for more tests. Follow these instructions at home: Eating and drinking  Eat a diet that includes fresh fruits and vegetables, whole grains, lean protein, and low-fat dairy products. Take vitamin and mineral supplements as recommended by your health care provider. Do  not drink alcohol if: Your health care provider tells you not to drink. You are pregnant, may be pregnant, or are planning to become pregnant. If you drink alcohol: Limit how much you have to 0-1 drink a day. Be aware of how much alcohol is in your drink. In the [place] one drink equals one 12 oz bottle of beer (355 mL), one 5 oz glass of wine (148 mL), or one 1 oz glass of hard liquor (44 mL).  Lifestyle Take daily care of your teeth and gums. Brush your teeth every morning and night with fluoride toothpaste. Floss one time each day. Stay active. Exercise for at least 30 minutes 5 or more days each week. Do not use any products that contain nicotine or tobacco, such as cigarettes, e-cigarettes, and chewing tobacco. If you need help quitting, ask your health care provider. Do not use drugs. If you are sexually active, practice safe sex. Use a condom or other form of protection to prevent STIs (sexually transmitted infections). If you do not wish to become pregnant, use a form of birth control. If you plan to become pregnant, see your health care provider for a prepregnancy visit. If told by your health care provider, take low-dose aspirin daily starting at age 54. Find healthy ways to cope with stress, such as: Meditation, yoga, or listening to music. Journaling. Talking to a trusted  person. Spending time with friends and family. Safety Always wear your seat belt while driving or riding in a vehicle. Do not drive: If you have been drinking alcohol. Do not ride with someone who has been drinking. When you are tired or distracted. While texting. Wear a helmet and other protective equipment during sports activities. If you have firearms in your house, make sure you follow all gun safety procedures. What's next? Visit your health care provider once a year for an annual wellness visit. Ask your health care provider how often you should have your eyes and teeth checked. Stay up to date on all  vaccines. This information is not intended to replace advice given to you by your health care provider. Make sure you discuss any questions you have with your healthcare provider. Document Revised: 10/09/2019 Document Reviewed: 09/15/2017 Elsevier Patient Education  2022 [employer].

## 2020-09-05 ENCOUNTER — Other Ambulatory Visit: Payer: Self-pay | Admitting: Nurse Practitioner

## 2020-09-05 ENCOUNTER — Encounter: Payer: Self-pay | Admitting: Nurse Practitioner

## 2020-09-05 DIAGNOSIS — I10 Essential (primary) hypertension: Secondary | ICD-10-CM

## 2020-09-05 LAB — LIPID PANEL
Chol/HDL Ratio: 4.3 ratio (ref 0.0–4.4)
Cholesterol, Total: 187 mg/dL (ref 100–199)
HDL: 44 mg/dL (ref >39)
LDL Chol Calc (NIH): 105 mg/dL — ABNORMAL HIGH (ref 0–99)
Triglycerides: 222 mg/dL — ABNORMAL HIGH (ref 0–149)
VLDL Cholesterol Cal: 38 mg/dL (ref 5–40)

## 2020-09-05 LAB — CBC WITH DIFF/PLATELET
Basophils Absolute: 0.1 10*3/uL (ref 0.0–0.2)
Basos: 1 %
EOS (ABSOLUTE): 0.2 10*3/uL (ref 0.0–0.4)
Eos: 4 %
Hematocrit: 38.2 % (ref 34.0–46.6)
Hemoglobin: 12.9 g/dL (ref 11.1–15.9)
Immature Grans (Abs): 0 10*3/uL (ref 0.0–0.1)
Immature Granulocytes: 0 %
Lymphocytes Absolute: 2.1 10*3/uL (ref 0.7–3.1)
Lymphs: 38 %
MCH: 31.7 pg (ref 26.6–33.0)
MCHC: 33.8 g/dL (ref 31.5–35.7)
MCV: 94 fL (ref 79–97)
Monocytes Absolute: 0.4 10*3/uL (ref 0.1–0.9)
Monocytes: 7 %
Neutrophils Absolute: 2.7 10*3/uL (ref 1.4–7.0)
Neutrophils: 50 %
Platelets: 270 10*3/uL (ref 150–450)
RBC: 4.07 x10E6/uL (ref 3.77–5.28)
RDW: 12.4 % (ref 11.7–15.4)
WBC: 5.4 10*3/uL (ref 3.4–10.8)

## 2020-09-05 LAB — COMPREHENSIVE METABOLIC PANEL
ALT: 13 IU/L (ref 0–32)
AST: 19 IU/L (ref 0–40)
Albumin/Globulin Ratio: 2 (ref 1.2–2.2)
Albumin: 4.4 g/dL (ref 3.8–4.8)
Alkaline Phosphatase: 100 IU/L (ref 44–121)
BUN/Creatinine Ratio: 13 (ref 12–28)
BUN: 17 mg/dL (ref 8–27)
Bilirubin Total: 0.2 mg/dL (ref 0.0–1.2)
CO2: 23 mmol/L (ref 20–29)
Calcium: 9.6 mg/dL (ref 8.7–10.3)
Chloride: 102 mmol/L (ref 96–106)
Creatinine, Ser: 1.26 mg/dL — ABNORMAL HIGH (ref 0.57–1.00)
Globulin, Total: 2.2 g/dL (ref 1.5–4.5)
Glucose: 122 mg/dL — ABNORMAL HIGH (ref 65–99)
Potassium: 4.2 mmol/L (ref 3.5–5.2)
Sodium: 138 mmol/L (ref 134–144)
Total Protein: 6.6 g/dL (ref 6.0–8.5)
eGFR: 49 mL/min/{1.73_m2} — ABNORMAL LOW (ref >59)

## 2020-09-05 LAB — TSH: TSH: 2.51 u[IU]/mL (ref 0.450–4.500)

## 2020-09-05 LAB — CARDIOVASCULAR RISK ASSESSMENT

## 2020-09-05 MED ORDER — AMLODIPINE BESYLATE 5 MG PO TABS: 5.0000 mg | ORAL_TABLET | Freq: Every day | ORAL | 1 refills | Status: DC

## 2020-09-08 LAB — SPECIMEN STATUS REPORT

## 2020-09-08 LAB — HGB A1C W/O EAG: Hgb A1c MFr Bld: 5.7 % — ABNORMAL HIGH (ref 4.8–5.6)

## 2020-09-08 NOTE — Progress Notes (Signed)
Left message to c/b.

## 2020-09-09 ENCOUNTER — Other Ambulatory Visit: Payer: Self-pay

## 2020-09-09 DIAGNOSIS — I10 Essential (primary) hypertension: Secondary | ICD-10-CM

## 2020-09-09 MED ORDER — AMLODIPINE BESYLATE 5 MG PO TABS: 5.0000 mg | ORAL_TABLET | Freq: Every day | ORAL | 0 refills | Status: DC

## 2020-09-11 ENCOUNTER — Other Ambulatory Visit: Payer: Self-pay

## 2020-09-11 DIAGNOSIS — I10 Essential (primary) hypertension: Secondary | ICD-10-CM

## 2020-09-11 MED ORDER — AMLODIPINE BESYLATE 5 MG PO TABS: 5.0000 mg | ORAL_TABLET | Freq: Every day | ORAL | 0 refills | Status: DC

## 2020-10-01 ENCOUNTER — Other Ambulatory Visit: Payer: Self-pay

## 2020-10-01 ENCOUNTER — Encounter: Payer: Self-pay | Admitting: Nurse Practitioner

## 2020-10-01 ENCOUNTER — Ambulatory Visit (INDEPENDENT_AMBULATORY_CARE_PROVIDER_SITE_OTHER): Payer: PRIVATE HEALTH INSURANCE | Admitting: Nurse Practitioner

## 2020-10-01 VITALS — BP 138/78 | HR 67 | Temp 97.4°F | Ht 66.0 in | Wt 203.0 lb

## 2020-10-01 DIAGNOSIS — N952 Postmenopausal atrophic vaginitis: Secondary | ICD-10-CM | POA: Diagnosis not present

## 2020-10-01 DIAGNOSIS — I10 Essential (primary) hypertension: Secondary | ICD-10-CM

## 2020-10-01 DIAGNOSIS — F411 Generalized anxiety disorder: Secondary | ICD-10-CM

## 2020-10-01 MED ORDER — ESTRADIOL 0.1 MG/GM VA CREA: TOPICAL_CREAM | VAGINAL | 12 refills | Status: DC

## 2020-10-01 MED ORDER — BUSPIRONE HCL 10 MG PO TABS: 10.0000 mg | ORAL_TABLET | Freq: Three times a day (TID) | ORAL | 0 refills | Status: DC

## 2020-10-01 NOTE — Patient Instructions (Addendum)
Begin Buspar 10 mg three times daily,notify office if you experience any side effects immediately Begin Estrace cream to vaginal area nightly for two weeks, then twice weekly Follow-up 4 weeks Menopause and Hormone Replacement Therapy Menopause is a normal time of life when menstrual periods stop completely and the ovaries stop producing the female hormones estrogen and progesterone. Low levels of these hormones can affect your health and cause symptoms. Hormone replacement therapy (HRT) can relieve some of those symptoms. HRT is the use of artificial (synthetic) hormones to replace hormones that your body has stopped producing because you have reached menopause. Types of HRT HRT may consist of the synthetic hormones estrogen and progestin, or it may consist of estrogen-only therapy. You and your health care provider will decide which form of HRT is best for you. If you choose to be on HRT and you have a uterus, estrogen and progestin are usually prescribed. Estrogen-only therapy is used for women who do not have a uterus. Possible options for taking HRT include: Pills. Patches. Gels. Sprays. Vaginal cream. Vaginal rings. Vaginal inserts. The amount of hormones that you take and how long you take them varies according to your health. It is important to: Begin HRT with the lowest possible dosage. Stop HRT as soon as your health care provider tells you to stop. Work with your health care provider so that you feel informed and comfortable with your decisions. Tell a health care provider about: Any allergies you have. Whether you have had blood clots or know of any risk factors you may have for blood clots. Whether you or family members have had cancer, especially cancer of the breasts, ovaries, or uterus. Any surgeries you have had. All medicines you are taking, including vitamins, herbs, eye drops, creams, and over-the-counter medicines. Whether you are pregnant or may be pregnant. Any  medical conditions you have. What are the benefits? HRT can reduce the frequency and severity of menopausal symptoms. Benefits of HRT vary according to the kind of symptoms that you have, how severe they are, and your overall health. HRT may help to improve the following symptoms of menopause: Hot flashes and night sweats. These are sudden feelings of heat that spread over the face and body. The skin may turn red, like a blush. Night sweats are hot flashes that happen while you are sleeping or trying to sleep. Bone loss (osteoporosis). The body loses calcium more quickly after menopause, causing the bones to become weaker. This can increase the risk for bone breaks (fractures). Vaginal dryness. The lining of the vagina can become thin and dry, which can cause pain during sex or cause infection, burning, or itching. Urinary tract infections. Urinary incontinence. This is the inability to control when you urinate. Irritability. Short-term memory problems. What are the risks? Risks of HRT vary depending on your individual health and medical history. Risks of HRT also depend on whether you receive both estrogen and progestin or you receive estrogen only. HRT may increase the risk of: Spotting. This is when a small amount of blood leaks from the vagina unexpectedly. Endometrial cancer. This cancer is in the lining of the uterus (endometrium). Breast cancer. Increased density of breast tissue. This can make it harder to find breast cancer on a breast X-ray (mammogram). Stroke. Heart disease. Blood clots. Gallbladder disease or liver disease. Risks of HRT can increase if you have any of the following conditions: Endometrial cancer. Liver disease. Heart disease. Breast cancer. History of blood clots. History of stroke. Follow  these instructions at home: Pap tests Have Pap tests done as often as told by your health care provider. A Pap test is sometimes called a Pap smear. It is a screening test  that is used to check for signs of cancer of the cervix and vagina. A Pap test can also identify the presence of infection or precancerous changes. Pap tests may be done: Every 3 years, starting at age 89. Every 5 years, starting after age 94, in combination with testing for human papillomavirus (HPV). More often or less often depending on other medical conditions you have, your age, and other risk factors. It is up to you to get the results of your Pap test. Ask your health care provider, or the department that is doing the test, when your results will be ready. General instructions Take over-the-counter and prescription medicines only as told by your health care provider. Do not use any products that contain nicotine or tobacco. These products include cigarettes, chewing tobacco, and vaping devices, such as e-cigarettes. If you need help quitting, ask your health care provider. Get mammograms, pelvic exams, and medical checkups as often as told by your health care provider. Keep all follow-up visits. This is important. Contact a health care provider if you have: Pain or swelling in your legs. Lumps or changes in your breasts or armpits. Pain, burning, or bleeding when you urinate. Unusual vaginal bleeding. Dizziness or headaches. Pain in your abdomen. Get help right away if you have: Shortness of breath. Chest pain. Slurred speech. Weakness or numbness in any part of your arms or legs. These symptoms may represent a serious problem that is an emergency. Do not wait to see if the symptoms will go away. Get medical help right away. Call your local emergency services (911 in the [place] Do not drive yourself to the hospital. Summary Menopause is a normal time of life when menstrual periods stop completely and the ovaries stop producing the female hormones estrogen and progesterone. HRT can reduce the frequency and severity of menopausal symptoms. Risks of HRT vary depending on your individual  health and medical history. This information is not intended to replace advice given to you by your health care provider. Make sure you discuss any questions you have with your health care provider. Document Revised: 07/09/2019 Document Reviewed: 07/09/2019 Elsevier Patient Education  [place].  Generalized Anxiety Disorder, Adult Generalized anxiety disorder (GAD) is a mental health condition. Unlike normal worries, anxiety related to GAD is not triggered by a specific event. These worries do not fade or get better with time. GAD interferes with relationships, work, and school. GAD symptoms can vary from mild to severe. People with severe GAD can have intense waves of anxiety with physical symptoms that are similar to panic attacks. What are the causes? The exact cause of GAD is not known, but the following are believed to have an impact: Differences in natural brain chemicals. Genes passed down from parents to children. Differences in the way threats are perceived. Development during childhood. Personality. What increases the risk? The following factors may make you more likely to develop this condition: Being female. Having a family history of anxiety disorders. Being very shy. Experiencing very stressful life events, such as the death of a loved one. Having a very stressful family environment. What are the signs or symptoms? People with GAD often worry excessively about many things in their lives, such as their health and family. Symptoms may also include: Mental and emotional symptoms: Worrying  excessively about natural disasters. Fear of being late. Difficulty concentrating. Fears that others are judging your performance. Physical symptoms: Fatigue. Headaches, muscle tension, muscle twitches, trembling, or feeling shaky. Feeling like your heart is pounding or beating very fast. Feeling out of breath or like you cannot take a deep breath. Having trouble falling asleep  or staying asleep, or experiencing restlessness. Sweating. Nausea, diarrhea, or irritable bowel syndrome (IBS). Behavioral symptoms: Experiencing erratic moods or irritability. Avoidance of new situations. Avoidance of people. Extreme difficulty making decisions. How is this diagnosed? This condition is diagnosed based on your symptoms and medical history. You will also have a physical exam. Your health care provider may perform tests to rule out other possible causes of your symptoms. To be diagnosed with GAD, a person must have anxiety that: Is out of his or her control. Affects several different aspects of his or her life, such as work and relationships. Causes distress that makes him or her unable to take part in normal activities. Includes at least three symptoms of GAD, such as restlessness, fatigue, trouble concentrating, irritability, muscle tension, or sleep problems. Before your health care provider can confirm a diagnosis of GAD, these symptoms must be present more days than they are not, and they must last for 6 months or longer. How is this treated? This condition may be treated with: Medicine. Antidepressant medicine is usually prescribed for long-term daily control. Anti-anxiety medicines may be added in severe cases, especially when panic attacks occur. Talk therapy (psychotherapy). Certain types of talk therapy can be helpful in treating GAD by providing support, education, and guidance. Options include: Cognitive behavioral therapy (CBT). People learn coping skills and self-calming techniques to ease their physical symptoms. They learn to identify unrealistic thoughts and behaviors and to replace them with more appropriate thoughts and behaviors. Acceptance and commitment therapy (ACT). This treatment teaches people how to be mindful as a way to cope with unwanted thoughts and feelings. Biofeedback. This process trains you to manage your body's response (physiological  response) through breathing techniques and relaxation methods. You will work with a therapist while machines are used to monitor your physical symptoms. Stress management techniques. These include yoga, meditation, and exercise. A mental health specialist can help determine which treatment is best for you. Some people see improvement with one type of therapy. However, other people require a combination of therapies. Follow these instructions at home: Lifestyle Maintain a consistent routine and schedule. Anticipate stressful situations. Create a plan, and allow extra time to work with your plan. Practice stress management or self-calming techniques that you have learned from your therapist or your health care provider. General instructions Take over-the-counter and prescription medicines only as told by your health care provider. Understand that you are likely to have setbacks. Accept this and be kind to yourself as you persist to take better care of yourself. Recognize and accept your accomplishments, even if you judge them as small. Keep all follow-up visits as told by your health care provider. This is important. Contact a health care provider if: Your symptoms do not get better. Your symptoms get worse. You have signs of depression, such as: A persistently sad or irritable mood. Loss of enjoyment in activities that used to bring you joy. Change in weight or eating. Changes in sleeping habits. Avoiding friends or family members. Loss of energy for normal tasks. Feelings of guilt or worthlessness. Get help right away if: You have serious thoughts about hurting yourself or others. If you ever feel  like you may hurt yourself or others, or have thoughts about taking your own life, get help right away. Go to your nearest emergency department or: Call your local emergency services (911 in the [place] Call a suicide crisis helpline, such as the [facility] at  367-345-0533. This is open 24 hours a day in the [place] Text the Crisis Text Line at 6260642984 (in the [place].). Summary Generalized anxiety disorder (GAD) is a mental health condition that involves worry that is not triggered by a specific event. People with GAD often worry excessively about many things in their lives, such as their health and family. GAD may cause symptoms such as restlessness, trouble concentrating, sleep problems, frequent sweating, nausea, diarrhea, headaches, and trembling or muscle twitching. A mental health specialist can help determine which treatment is best for you. Some people see improvement with one type of therapy. However, other people require a combination of therapies. This information is not intended to replace advice given to you by your health care provider. Make sure you discuss any questions you have with your health care provider. Managing Anxiety, Adult After being diagnosed with an anxiety disorder, you may be relieved to know why you have felt or behaved a certain way. You may also feel overwhelmed about the treatment ahead and what it will mean for your life. With care and support, you can manage this condition and recover from it. How to manage lifestyle changes Managing stress and anxiety Stress is your body's reaction to life changes and events, both good and bad. Most stress will last just a few hours, but stress can be ongoing and can lead to more than just stress. Although stress can play a major role in anxiety, it is not the same as anxiety. Stress is usually caused by something external, such as a deadline, test, or competition. Stress normally passes after the triggering event has ended.  Anxiety is caused by something internal, such as imagining a terrible outcome or worrying that something will go wrong that will devastate you. Anxiety often does not go away even after the triggering event is over, and it can become long-term (chronic) worry. It is  important to understand the differences between stress and anxiety and to manage your stress effectively so that it does not lead to an anxious response. Talk with your health care provider or a counselor to learn more about reducing anxiety and stress. He or she may suggest tension reduction techniques, such as: Music therapy. This can include creating or listening to music that you enjoy and that inspires you. Mindfulness-based meditation. This involves being aware of your normal breaths while not trying to control your breathing. It can be done while sitting or walking. Centering prayer. This involves focusing on a word, phrase, or sacred image that means something to you and brings you peace. Deep breathing. To do this, expand your stomach and inhale slowly through your nose. Hold your breath for 3-5 seconds. Then exhale slowly, letting your stomach muscles relax. Self-talk. This involves identifying thought patterns that lead to anxiety reactions and changing those patterns. Muscle relaxation. This involves tensing muscles and then relaxing them. Choose a tension reduction technique that suits your lifestyle and personality. These techniques take time and practice. Set aside 5-15 minutes a day to do them. Therapists can offer counseling and training in these techniques. The training to help with anxiety may be covered by some insurance plans. Other things you can do to manage stress and anxiety  include: Keeping a stress/anxiety diary. This can help you learn what triggers your reaction and then learn ways to manage your response. Thinking about how you react to certain situations. You may not be able to control everything, but you can control your response. Making time for activities that help you relax and not feeling guilty about spending your time in this way. Visual imagery and yoga can help you stay calm and relax.  Medicines Medicines can help ease symptoms. Medicines for anxiety  include: Anti-anxiety drugs. Antidepressants. Medicines are often used as a primary treatment for anxiety disorder. Medicines will be prescribed by a health care provider. When used together, medicines, psychotherapy, and tension reduction techniques may be the most effective treatment. Relationships Relationships can play a big part in helping you recover. Try to spend more time connecting with trusted friends and family members. Consider going to couples counseling, taking family education classes, or going to family therapy. Therapy can help you and others better understand your condition. How to recognize changes in your anxiety Everyone responds differently to treatment for anxiety. Recovery from anxiety happens when symptoms decrease and stop interfering with your daily activities at home or work. This may mean that you will start to: Have better concentration and focus. Worry will interfere less in your daily thinking. Sleep better. Be less irritable. Have more energy. Have improved memory. It is important to recognize when your condition is getting worse. Contact your health care provider if your symptoms interfere with home or work and you feel like your condition is not improving. Follow these instructions at home: Activity Exercise. Most adults should do the following: Exercise for at least 150 minutes each week. The exercise should increase your heart rate and make you sweat (moderate-intensity exercise). Strengthening exercises at least twice a week. Get the right amount and quality of sleep. Most adults need 7-9 hours of sleep each night. Lifestyle  Eat a healthy diet that includes plenty of vegetables, fruits, whole grains, low-fat dairy products, and lean protein. Do not eat a lot of foods that are high in solid fats, added sugars, or salt. Make choices that simplify your life. Do not use any products that contain nicotine or tobacco, such as cigarettes, e-cigarettes, and  chewing tobacco. If you need help quitting, ask your health care provider. Avoid caffeine, alcohol, and certain over-the-counter cold medicines. These may make you feel worse. Ask your pharmacist which medicines to avoid. General instructions Take over-the-counter and prescription medicines only as told by your health care provider. Keep all follow-up visits as told by your health care provider. This is important. Where to find support You can get help and support from these sources: Self-help groups. Online and [employer]. A trusted spiritual leader. Couples counseling. Family education classes. Family therapy. Where to find more information You may find that joining a support group helps you deal with your anxiety. The following sources can help you locate counselors or support groups near you: [place]: www.mentalhealth[place].net Anxiety and Depression Association of [place] (ADAA): https://www.clark.net/ National Alliance on Mental Illness (NAMI): www.nami.org Contact a health care provider if you: Have a hard time staying focused or finishing daily tasks. Spend many hours a day feeling worried about everyday life. Become exhausted by worry. Start to have headaches, feel tense, or have nausea. Urinate more than normal. Have diarrhea. Get help right away if you have: A racing heart and shortness of breath. Thoughts of hurting yourself or others. If you ever feel like you  may hurt yourself or others, or have thoughts about taking your own life, get help right away. You can go to your nearest emergency department or call: Your local emergency services (911 in the [place] A suicide crisis helpline, such as the [facility] at 8457932570. This is open 24 hours a day. Summary Taking steps to learn and use tension reduction techniques can help calm you and help prevent triggering an anxiety reaction. When used together, medicines,  psychotherapy, and tension reduction techniques may be the most effective treatment. Family, friends, and partners can play a big part in helping you recover from an anxiety disorder. This information is not intended to replace advice given to you by your health care provider. Make sure you discuss any questions you have with your health care provider. Document Revised: 06/06/2018 Document Reviewed: 06/06/2018 Elsevier Patient Education  [place] Revised: 10/25/2018 Document Reviewed: 10/25/2018 Elsevier Patient Education  2022 [employer].

## 2020-10-01 NOTE — Progress Notes (Signed)
Subjective:  Patient ID: Sandy Beck, female    DOB: February 08, 1960  Age: 61 y.o. MRN: [id number]  Chief Complaint  Patient presents with   Hypertension    Side effects after starting the amlodipine    HPI Sandy Beck is a 61 year old Caucasian female that presents for evaluation of possible side effects of hypertension medications. States she has experienced dizziness and flushing when taking Amlodipine 5 mg. States she has chronic swelling to right ankle related to osteoarthritis. States she would like to continue medication. BP 138/78 in-office today.   [name] tells me that she has been experiencing decreased libido, fatigue, irritability, and dwelling on thoughts, and trouble "letting things go". States she has only been intimate with her spouse one time since her kidney cancer surgery. States lack of intimacy has led to quarrels with her spouse. She denies previous history of anxiety or depression.    Depression screen [facility] 2/9 10/01/2020 06/03/2020  Decreased Interest 1 0  Down, Depressed, Hopeless 0 0  PHQ - 2 Score 1 0  Altered sleeping 1 -  Tired, decreased energy 3 -  Change in appetite 1 -  Feeling bad or failure about yourself  0 -  Trouble concentrating 0 -  Moving slowly or fidgety/restless 0 -  Suicidal thoughts 0 -  PHQ-9 Score 6 -  Difficult doing work/chores Not difficult at all -    GAD-7 Results GAD-7 Generalized Anxiety Disorder Screening Tool 10/01/2020  1. Feeling Nervous, Anxious, or on Edge 3  2. Not Being Able to Stop or Control Worrying 3  3. Worrying Too Much About Different Things 3  4. Trouble Relaxing 3  5. Being So Restless it's Hard To Sit Still 3  6. Becoming Easily Annoyed or Irritable 3  7. Feeling Afraid As If Something Awful Might Happen 0  Total GAD-7 Score 18  Difficulty At Work, Home, or Getting  Along With Others? Not difficult at all       Current Outpatient Medications on File Prior to Visit  Medication Sig Dispense Refill   amLODipine  (NORVASC) 5 MG tablet Take 1 tablet (5 mg total) by mouth daily. 30 tablet 0   levothyroxine (SYNTHROID) 100 MCG tablet Take 1 tablet (100 mcg total) by mouth daily before breakfast. 90 tablet 3   losartan-hydrochlorothiazide (HYZAAR) 100-25 MG tablet TAKE 1 TABLET BY MOUTH  DAILY 90 tablet 3   omeprazole (PRILOSEC) 40 MG capsule Take 1 capsule (40 mg total) by mouth daily. 90 capsule 3   pravastatin (PRAVACHOL) 40 MG tablet pravastatin 40 mg tablet  Take 0.5 tablets every day by oral route for 90 days. 90 tablet 0   traZODone (DESYREL) 150 MG tablet TAKE 1 TABLET BY MOUTH  DAILY 90 tablet 3   No current facility-administered medications on file prior to visit.   Past Medical History:  Diagnosis Date   Allergy    Arthritis    Cancer ([place]) 2022   left renal cancer, had left nephrectomy   GERD (gastroesophageal reflux disease)    Heart murmur    Hyperlipidemia    Hypertension    Sleep apnea    not currently using cpap   Thyroid disease    Past Surgical History:  Procedure Laterality Date   LAPAROTOMY  1980s   tubal pregnancy, left fallopian tube removal   NEPHRECTOMY Left     Family History  Problem Relation Age of Onset   Alzheimer's disease Mother    Diabetes Mellitus II Mother  Hypertension Father    Stroke Sister    Hypertension Sister    Social History   Socioeconomic History   Marital status: Married    Spouse name: Not on file   Number of children: 1   Years of education: Not on file   Highest education level: Not on file  Occupational History   Occupation: CASHIER  Tobacco Use   Smoking status: Every Day    Packs/day: 0.50    Years: 45.00    Pack years: 22.50    Types: Cigarettes   Smokeless tobacco: Never  Vaping Use   Vaping Use: Never used  Substance and Sexual Activity   Alcohol use: Not Currently   Drug use: Never   Sexual activity: Not on file  Other Topics Concern   Not on file  Social History Narrative   Not on file   Social  Determinants of Health   Financial Resource Strain: Not on file  Food Insecurity: Not on file  Transportation Needs: Not on file  Physical Activity: Not on file  Stress: Not on file  Social Connections: Not on file    Review of Systems  Constitutional:  Positive for fatigue. Negative for appetite change and fever.  HENT:  Negative for congestion, ear pain, sinus pressure and sore throat.   Eyes:  Negative for pain.  Respiratory:  Negative for cough, chest tightness, shortness of breath and wheezing.   Cardiovascular:  Negative for chest pain and palpitations.  Gastrointestinal:  Negative for abdominal pain, constipation, diarrhea, nausea and vomiting.  Endocrine: Positive for heat intolerance (flushed face).  Genitourinary:  Positive for vaginal pain (vaginal dryness). Negative for dysuria and hematuria.  Musculoskeletal:  Negative for arthralgias, back pain, joint swelling and myalgias.  Skin:  Negative for rash.  Allergic/Immunologic: Negative.   Neurological:  Negative for dizziness, weakness and headaches.  Hematological: Negative.   Psychiatric/Behavioral:  Positive for agitation (irritability). Negative for dysphoric mood. The patient is nervous/anxious.     Objective:  BP 138/78 (BP Location: Left Arm, Patient Position: Sitting)   Pulse 67   Temp (!) 97.4 F (36.3 C) (Temporal)   Ht '5\' 6"'$  (1.676 m)   Wt 203 lb (92.1 kg)   SpO2 97%   BMI 32.77 kg/m   BP/Weight 10/01/2020 09/04/2020 [id number]  Systolic BP 0000000 [id number] 0000000  Diastolic BP 78 78 88  Wt. (Lbs) 203 203 203  BMI 32.77 32.77 32.77    Physical Exam Vitals reviewed.  Constitutional:      Appearance: Normal appearance.  Skin:    General: Skin is warm and dry.     Capillary Refill: Capillary refill takes less than 2 seconds.  Neurological:     General: No focal deficit present.     Mental Status: She is alert and oriented to person, place, and time.  Psychiatric:        Mood and Affect: Mood normal.         Behavior: Behavior normal.     Lab Results  Component Value Date   WBC 5.4 09/04/2020   HGB 12.9 09/04/2020   HCT 38.2 09/04/2020   PLT 270 09/04/2020   GLUCOSE 122 (H) 09/04/2020   CHOL 187 09/04/2020   TRIG 222 (H) 09/04/2020   HDL 44 09/04/2020   LDLCALC 105 (H) 09/04/2020   ALT 13 09/04/2020   AST 19 09/04/2020   NA 138 09/04/2020   K 4.2 09/04/2020   CL 102 09/04/2020   CREATININE 1.26 (H) 09/04/2020  BUN 17 09/04/2020   CO2 23 09/04/2020   TSH 2.510 09/04/2020   HGBA1C 5.7 (H) 09/04/2020      Assessment & Plan:   . 1. Essential (primary) hypertension-at goal -continue Hyzaar daily -continue Amlodipine 5 mg daily -DASH diet -monitor BP at home, keep log -monitor and report side effects of BP medication  2. Vaginal atrophy - estradiol (ESTRACE VAGINAL) 0.1 MG/GM vaginal cream; Apply small pea-size amount to vaginal area nightly for 14 days, then use twice weekly  Dispense: 42.5 g; Refill: 12  3. GAD (generalized anxiety disorder) - busPIRone (BUSPAR) 10 MG tablet; Take 1 tablet (10 mg total) by mouth 3 (three) times daily.  Dispense: 90 tablet; Refill: 0  Notify office immediately of any adverse side effects of medication Seek emergency medical care for any severe increase in anxiety symptoms or any other concerns  Begin Buspar 10 mg three times daily,notify office if you experience any side effects immediately Begin Estrace cream to vaginal area nightly for two weeks, then twice weekly Follow-up 4 weeks      Follow-up: 4-weeks  An After Visit Summary was printed and given to the patient.  I,Lauren M Auman,acting as a Education administrator for [employer], NP.,have documented all relevant documentation on the behalf of Rip Harbour, NP,as directed by  Rip Harbour, NP while in the presence of Rip Harbour, NP.   I, Rip Harbour, NP, have reviewed all documentation for this visit. The documentation on 10/01/20 for the exam, diagnosis, procedures,  and orders are all accurate and complete.    Rip Harbour, NP [place] [phone]

## 2020-10-28 NOTE — Progress Notes (Signed)
Subjective:  Patient ID: Sandy Beck, female    DOB: August 20, 1960  Age: 60 y.o. MRN: [id number]  Chief Complaint  Patient presents with   Depression   Hypertension    HPI  Sandy Beck is a 60 year old Caucasian female that presents for follow-up of anxiety medication. She tells me she is experiencing acute dysuria, urgency, and frequency. Onset of symptoms was 3-days ago. Sandy Beck also states she has bilateral hand pain and swelling. States hand pain is interfering with quality of life and her job. Denies previous treatment for hand pain. States she has osteoarthritis in right foot that is causing pain and swelling. She has requested referral to an orthopedic specialist.   Anxiety, Follow-up  She was last seen for anxiety 4 weeks ago. Changes made at last visit include Buspar 10 mg TID.   She reports excellent compliance with treatment. She reports excellent tolerance of treatment. She is not having side effects.   She feels her anxiety is mild and Improved since last visit.  Symptoms: No chest pain No difficulty concentrating  No dizziness No fatigue  No feelings of losing control No insomnia  No irritable No palpitations  No panic attacks No racing thoughts  No shortness of breath No sweating  No tremors/shakes    GAD-7 Results GAD-7 Generalized Anxiety Disorder Screening Tool 10/01/2020  1. Feeling Nervous, Anxious, or on Edge 3  2. Not Being Able to Stop or Control Worrying 3  3. Worrying Too Much About Different Things 3  4. Trouble Relaxing 3  5. Being So Restless it's Hard To Sit Still 3  6. Becoming Easily Annoyed or Irritable 3  7. Feeling Afraid As If Something Awful Might Happen 0  Total GAD-7 Score 18  Difficulty At Work, Home, or Getting  Along With Others? Not difficult at all       Current Outpatient Medications on File Prior to Visit  Medication Sig Dispense Refill   amLODipine (NORVASC) 5 MG tablet Take 1 tablet (5 mg total) by mouth daily. 30 tablet 0    busPIRone (BUSPAR) 10 MG tablet Take 1 tablet (10 mg total) by mouth 3 (three) times daily. 90 tablet 0   estradiol (ESTRACE VAGINAL) 0.1 MG/GM vaginal cream Apply small pea-size amount to vaginal area nightly for 14 days, then use twice weekly 42.5 g 12   levothyroxine (SYNTHROID) 100 MCG tablet Take 1 tablet (100 mcg total) by mouth daily before breakfast. 90 tablet 3   losartan-hydrochlorothiazide (HYZAAR) 100-25 MG tablet TAKE 1 TABLET BY MOUTH  DAILY 90 tablet 3   omeprazole (PRILOSEC) 40 MG capsule Take 1 capsule (40 mg total) by mouth daily. 90 capsule 3   pravastatin (PRAVACHOL) 40 MG tablet pravastatin 40 mg tablet  Take 0.5 tablets every day by oral route for 90 days. 90 tablet 0   traZODone (DESYREL) 150 MG tablet TAKE 1 TABLET BY MOUTH  DAILY 90 tablet 3   No current facility-administered medications on file prior to visit.   Past Medical History:  Diagnosis Date   Allergy    Arthritis    Cancer ([place]) 2022   left renal cancer, had left nephrectomy   GERD (gastroesophageal reflux disease)    Heart murmur    Hyperlipidemia    Hypertension    Sleep apnea    not currently using cpap   Thyroid disease    Past Surgical History:  Procedure Laterality Date   LAPAROTOMY  1980s   tubal pregnancy, left fallopian tube removal  NEPHRECTOMY Left     Family History  Problem Relation Age of Onset   Alzheimer's disease Mother    Diabetes Mellitus II Mother    Hypertension Father    Stroke Sister    Hypertension Sister    Social History   Socioeconomic History   Marital status: Married    Spouse name: Not on file   Number of children: 1   Years of education: Not on file   Highest education level: Not on file  Occupational History   Occupation: CASHIER  Tobacco Use   Smoking status: Every Day    Packs/day: 0.50    Years: 45.00    Pack years: 22.50    Types: Cigarettes   Smokeless tobacco: Never  Vaping Use   Vaping Use: Never used  Substance and Sexual Activity    Alcohol use: Not Currently   Drug use: Never   Sexual activity: Not on file  Other Topics Concern   Not on file  Social History Narrative   Not on file   Social Determinants of Health   Financial Resource Strain: Not on file  Food Insecurity: Not on file  Transportation Needs: Not on file  Physical Activity: Not on file  Stress: Not on file  Social Connections: Not on file    Review of Systems  Constitutional:  Negative for chills, fatigue and fever.  HENT:  Negative for congestion, ear pain, rhinorrhea and sore throat.   Respiratory:  Negative for cough and shortness of breath.   Cardiovascular:  Negative for chest pain.  Gastrointestinal:  Negative for abdominal pain, constipation, diarrhea, nausea and vomiting.  Endocrine: Negative for polyuria.  Genitourinary:  Positive for dysuria, frequency and urgency.  Musculoskeletal:  Positive for arthralgias (bilateral hands, right foot) and back pain (lower back). Negative for myalgias.  Skin: Negative.   Allergic/Immunologic: Negative.   Neurological: Negative.   Hematological: Negative.   Psychiatric/Behavioral:  Negative for dysphoric mood. The patient is not nervous/anxious.     Objective:  BP 124/78   Pulse 67   Temp (!) 95.5 F (35.3 C)   Ht 5\' 5"  (1.651 m)   Wt 204 lb (92.5 kg)   SpO2 100%   BMI 33.95 kg/m    BP/Weight 10/01/2020 09/04/2020 6/56/8127  Systolic BP 517 001 749  Diastolic BP 78 78 88  Wt. (Lbs) 203 203 203  BMI 32.77 32.77 32.77    Physical Exam Vitals reviewed.  Constitutional:      Appearance: Normal appearance.  Cardiovascular:     Rate and Rhythm: Normal rate and regular rhythm.     Pulses: Normal pulses.     Heart sounds: Normal heart sounds.  Pulmonary:     Effort: Pulmonary effort is normal.     Breath sounds: Normal breath sounds.  Abdominal:     General: Bowel sounds are normal.     Palpations: Abdomen is soft.  Musculoskeletal:        General: Swelling (right hand and foot)  and tenderness (bilateral hands, right foot) present.  Skin:    General: Skin is warm and dry.     Capillary Refill: Capillary refill takes less than 2 seconds.  Neurological:     General: No focal deficit present.     Mental Status: She is alert and oriented to person, place, and time.  Psychiatric:        Mood and Affect: Mood normal.        Behavior: Behavior normal.  Lab Results  Component Value Date   WBC 5.4 09/04/2020   HGB 12.9 09/04/2020   HCT 38.2 09/04/2020   PLT 270 09/04/2020   GLUCOSE 122 (H) 09/04/2020   CHOL 187 09/04/2020   TRIG 222 (H) 09/04/2020   HDL 44 09/04/2020   LDLCALC 105 (H) 09/04/2020   ALT 13 09/04/2020   AST 19 09/04/2020   NA 138 09/04/2020   K 4.2 09/04/2020   CL 102 09/04/2020   CREATININE 1.26 (H) 09/04/2020   BUN 17 09/04/2020   CO2 23 09/04/2020   TSH 2.510 09/04/2020   HGBA1C 5.7 (H) 09/04/2020      Assessment & Plan:   1. GAD (generalized anxiety disorder)-well controlled -Continue Buspar 10 mg TID  2. Acute cystitis without hematuria - POCT urinalysis dipstick - Urine Culture - nitrofurantoin, macrocrystal-monohydrate, (MACROBID) 100 MG capsule; Take 1 capsule (100 mg total) by mouth 2 (two) times daily.  Dispense: 14 capsule; Refill: 0  3. Bilateral carpal tunnel syndrome - Ambulatory referral to Orthopedic Surgery -Tylenol as directed  4. Osteoarthritis of right foot, unspecified osteoarthritis type - Ambulatory referral to Orthopedic Surgery -Tylenol as directed  5. History of candidiasis of vagina - fluconazole (DIFLUCAN) 150 MG tablet; Take 1 tablet (150 mg total) by mouth once for 1 dose.  Dispense: 1 tablet; Refill: 0  .     Take Macrobid 100 mg twice daily for 7 days Push fluids, especially waterWe will call you with referral to orthopedic for bilateral carpal tunnel syndrome and right foot pain    Follow-up: 03/12/20   An After Visit Summary was printed and given to the patient.  I, Rip Harbour, NP, have reviewed all documentation for this visit. The documentation on 10/29/20 for the exam, diagnosis, procedures, and orders are all accurate and complete.   Signed, Rip Harbour, NP [place] [phone]

## 2020-10-29 ENCOUNTER — Encounter: Payer: Self-pay | Admitting: Nurse Practitioner

## 2020-10-29 ENCOUNTER — Ambulatory Visit (INDEPENDENT_AMBULATORY_CARE_PROVIDER_SITE_OTHER): Payer: 59 | Admitting: Nurse Practitioner

## 2020-10-29 ENCOUNTER — Other Ambulatory Visit: Payer: Self-pay | Admitting: Nurse Practitioner

## 2020-10-29 ENCOUNTER — Other Ambulatory Visit: Payer: Self-pay

## 2020-10-29 VITALS — BP 124/78 | HR 67 | Temp 95.5°F | Ht 65.0 in | Wt 204.0 lb

## 2020-10-29 DIAGNOSIS — Z8619 Personal history of other infectious and parasitic diseases: Secondary | ICD-10-CM

## 2020-10-29 DIAGNOSIS — M19071 Primary osteoarthritis, right ankle and foot: Secondary | ICD-10-CM | POA: Diagnosis not present

## 2020-10-29 DIAGNOSIS — G5603 Carpal tunnel syndrome, bilateral upper limbs: Secondary | ICD-10-CM | POA: Diagnosis not present

## 2020-10-29 DIAGNOSIS — F411 Generalized anxiety disorder: Secondary | ICD-10-CM | POA: Diagnosis not present

## 2020-10-29 DIAGNOSIS — N3000 Acute cystitis without hematuria: Secondary | ICD-10-CM | POA: Diagnosis not present

## 2020-10-29 DIAGNOSIS — I10 Essential (primary) hypertension: Secondary | ICD-10-CM

## 2020-10-29 LAB — POCT URINALYSIS DIPSTICK
Bilirubin, UA: NEGATIVE
Blood, UA: NEGATIVE
Glucose, UA: NEGATIVE
Ketones, UA: NEGATIVE
Nitrite, UA: NEGATIVE
Protein, UA: POSITIVE — AB
Spec Grav, UA: >=1.03 — AB (ref 1.010–1.025)
Urobilinogen, UA: 0.2 E.U./dL
pH, UA: 6 (ref 5.0–8.0)

## 2020-10-29 MED ORDER — FLUCONAZOLE 150 MG PO TABS: 150.0000 mg | ORAL_TABLET | Freq: Once | ORAL | 0 refills | Status: AC

## 2020-10-29 MED ORDER — NITROFURANTOIN MONOHYD MACRO 100 MG PO CAPS: 100.0000 mg | ORAL_CAPSULE | Freq: Two times a day (BID) | ORAL | 0 refills | Status: DC

## 2020-10-29 NOTE — Patient Instructions (Signed)
Take Macrobid 100 mg twice daily for 7 days Push fluids, especially waterWe will call you with referral to orthopedic for bilateral carpal tunnel syndrome and right foot pain   Foot Pain Many things can cause foot pain. Some common causes are: An injury. A sprain. Arthritis. Blisters. Bunions. Follow these instructions at home: Managing pain, stiffness, and swelling If directed, put ice on the painful area: Put ice in a plastic bag. Place a towel between your skin and the bag. Leave the ice on for 20 minutes, 2-3 times a day.  Activity Do not stand or walk for long periods. Return to your normal activities as told by your health care provider. Ask your health care provider what activities are safe for you. Do stretches to relieve foot pain and stiffness as told by your health care provider. Do not lift anything that is heavier than 10 lb (4.5 kg), or the limit that you are told, until your health care provider says that it is safe. Lifting a lot of weight can put added pressure on your feet. Lifestyle Wear comfortable, supportive shoes that fit you well. Do not wear high heels. Keep your feet clean and dry. General instructions Take over-the-counter and prescription medicines only as told by your health care provider. Rub your foot gently. Pay attention to any changes in your symptoms. Keep all follow-up visits as told by your health care provider. This is important. Contact a health care provider if: Your pain does not get better after a few days of self-care. Your pain gets worse. You cannot stand on your foot. Get help right away if: Your foot is numb or tingling. Your foot or toes are swollen. Your foot or toes turn white or blue. You have warmth and redness along your foot. Summary Common causes of foot pain are injury, sprain, arthritis, blisters, or bunions. Ice, medicines, and comfortable shoes may help foot pain. Contact your health care provider if your pain does  not get better after a few days of self-care. This information is not intended to replace advice given to you by your health care provider. Make sure you discuss any questions you have with your health care provider. Document Revised: 04/09/2020 Document Reviewed: 04/09/2020 Elsevier Patient Education  2022 [place] Syndrome Carpal tunnel syndrome is a condition that causes pain, weakness, and numbness in your hand and arm. Numbness is when you cannot feel an area in your body. The carpal tunnel is a narrow area that is on the palm side of your wrist. Repeated wrist motion or certain diseases may cause swelling in the tunnel. This swelling can pinch the main nerve in the wrist. This nerve is called the median nerve. What are the causes? This condition may be caused by: Moving your hand and wrist over and over again while doing a task. Injury to the wrist. Arthritis. A sac of fluid (cyst) or abnormal growth (tumor) in the carpal tunnel. Fluid buildup during pregnancy. Use of tools that vibrate. Sometimes the cause is not known. What increases the risk? The following factors may make you more likely to have this condition: Having a job that makes you do these things: Move your hand over and over again. Work with tools that vibrate, such as drills or sanders. Being a woman. Having diabetes, obesity, thyroid problems, or kidney failure. What are the signs or symptoms? Symptoms of this condition include: A tingling feeling in your fingers. Tingling or loss of feeling in your  hand. Pain in your entire arm. This pain may get worse when you bend your wrist and elbow for a long time. Pain in your wrist that goes up your arm to your shoulder. Pain that goes down into your palm or fingers. Weakness in your hands. You may find it hard to grab and hold items. You may feel worse at night. How is this treated? This condition may be treated with: Lifestyle changes. You will  be asked to stop or change the activity that caused your problem. Doing exercises and activities that make bones, muscles, and tendons stronger (physical therapy). Learning how to use your hand again (occupational therapy). Medicines for pain and swelling. You may have injections in your wrist. A wrist splint or brace. Surgery. Follow these instructions at home: If you have a splint or brace: Wear the splint or brace as told by your doctor. Take it off only as told by your doctor. Loosen the splint if your fingers: Tingle. Become numb. Turn cold and blue. Keep the splint or brace clean. If the splint or brace is not waterproof: Do not let it get wet. Cover it with a watertight covering when you take a bath or a shower. Managing pain, stiffness, and swelling If told, put ice on the painful area: If you have a removable splint or brace, remove it as told by your doctor. Put ice in a plastic bag. Place a towel between your skin and the bag. Leave the ice on for 20 minutes, 2-3 times per day. Do not fall asleep with the cold pack on your skin. Take off the ice if your skin turns bright red. This is very important. If you cannot feel pain, heat, or cold, you have a greater risk of damage to the area. Move your fingers often to reduce stiffness and swelling. General instructions Take over-the-counter and prescription medicines only as told by your doctor. Rest your wrist from any activity that may cause pain. If needed, talk with your boss at work about changes that can help your wrist heal. Do exercises as told by your doctor, physical therapist, or occupational therapist. Keep all follow-up visits. Contact a doctor if: You have new symptoms. Medicine does not help your pain. Your symptoms get worse. Get help right away if: You have very bad numbness or tingling in your wrist or hand. Summary Carpal tunnel syndrome is a condition that causes pain in your hand and arm. It is often  caused by repeated wrist motions. Lifestyle changes and medicines are used to treat this problem. Surgery may help in very bad cases. Follow your doctor's instructions about wearing a splint, resting your wrist, keeping follow-up visits, and calling for help. This information is not intended to replace advice given to you by your health care provider. Make sure you discuss any questions you have with your health care provider. Document Revised: 05/17/2019 Document Reviewed: 05/17/2019 Elsevier Patient Education  [place].

## 2020-10-31 LAB — URINE CULTURE: Organism ID, Bacteria: NO GROWTH

## 2020-11-06 DIAGNOSIS — M19079 Primary osteoarthritis, unspecified ankle and foot: Secondary | ICD-10-CM | POA: Insufficient documentation

## 2021-03-10 NOTE — Progress Notes (Signed)
Subjective:  Patient ID: Sandy Beck, female    DOB: [birth year]  Age: 61 y.o. MRN: [id number]  CC: HTN HYPERLIPIDEMIA PREDIABETES  HPI  Sandy Beck is a 61 year old Caucasian female that presents for follow-up of hypertension, hyperlipidemia, and prediabetes. 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 She is followed by Dr Vernard Gambles, nephrologist. States she has a renal scan up-coming 03/23/21.  Sandy Beck states she has been experiencing UTI and vaginal yeast infection symptoms for a few days. Treatment has included OTC Monistat.   Hypertension, follow-up: She was last seen for hypertension 3 months ago.  BP at that visit was 140/78. Management since that visit includes Losartan-HCTZ 100-25 mg. Kidney function decreased from 52 to 49 and was started on Norvasc 5 mg.     She reports fair compliance with treatment. She is having side effects. Not sleeping as well with the trazodone  She is following a Regular diet. She is exercising. She does smoke.     Use of agents associated with hypertension: NSAIDS.   Outside blood pressures are not being checked Symptoms: No chest pain No chest pressure  No palpitations No syncope  No dyspnea No orthopnea  No paroxysmal nocturnal dyspnea No lower extremity edema   Pertinent labs: Lab Results  Component Value Date   CHOL 187 09/04/2020   HDL 44 09/04/2020   LDLCALC 105 (H) 09/04/2020   TRIG 222 (H) 09/04/2020   CHOLHDL 4.3 09/04/2020   Lab Results  Component Value Date   NA 138 09/04/2020   K 4.2 09/04/2020   CREATININE 1.26 (H) 09/04/2020   EGFR 49 (L) 09/04/2020   GLUCOSE 122 (H) 09/04/2020        Lipid/Cholesterol, Follow-up  Last lipid panel Other pertinent labs  Lab Results  Component Value Date   CHOL 187 09/04/2020   HDL 44 09/04/2020   LDLCALC 105 (H) 09/04/2020   TRIG 222 (H) 09/04/2020   CHOLHDL 4.3 09/04/2020   Lab Results  Component Value Date   ALT 13 09/04/2020   AST 19 09/04/2020    PLT 270 09/04/2020   TSH 2.510 09/04/2020     She was last seen for this 3 months ago.  Management since that visit includes Pravastatin 40 mg.          She reports excellent compliance with treatment. She is not having side effects.          Current diet: well balanced         Current exercise: walking  Prediabetes, Follow-up  Lab Results  Component Value Date   HGBA1C 5.7 (H) 09/04/2020   GLUCOSE 122 (H) 09/04/2020    Last seen for for this3 months ago.  Management since that visit includes diet. Current symptoms include none and have been stable.          Prior visit with dietician: no Current diet: in general, a "healthy" diet   Current exercise: walking  Pertinent Labs:    Component Value Date/Time   CHOL 187 09/04/2020 1011   TRIG 222 (H) 09/04/2020 1011   CHOLHDL 4.3 09/04/2020 1011   CREATININE 1.26 (H) 09/04/2020 1011    Wt Readings from Last 3 Encounters:  03/12/21 204 lb (92.5 kg)  10/29/20 204 lb (92.5 kg)  10/01/20 203 lb (92.1 kg)    GERD, Follow up:  The patient was last seen for GERD 3 months ago. Current treatment includes Omeprazole 40 mg daily She reports excellent compliance with treatment.  She is not having side effects.         She is NOT experiencing belching and eructation, dysphagia, or heartburn   Current Outpatient Medications on File Prior to Visit  Medication Sig Dispense Refill   amLODipine (NORVASC) 5 MG tablet Take 1 tablet (5 mg total) by mouth daily. 30 tablet 0   busPIRone (BUSPAR) 10 MG tablet Take 1 tablet (10 mg total) by mouth 3 (three) times daily. 90 tablet 0   estradiol (ESTRACE VAGINAL) 0.1 MG/GM vaginal cream Apply small pea-size amount to vaginal area nightly for 14 days, then use twice weekly 42.5 g 12   levothyroxine (SYNTHROID) 100 MCG tablet Take 1 tablet (100 mcg total) by mouth daily before breakfast. 90 tablet 3   losartan-hydrochlorothiazide (HYZAAR) 100-25 MG tablet TAKE 1 TABLET BY MOUTH  DAILY 90 tablet 3    nitrofurantoin, macrocrystal-monohydrate, (MACROBID) 100 MG capsule Take 1 capsule (100 mg total) by mouth 2 (two) times daily. 14 capsule 0   omeprazole (PRILOSEC) 40 MG capsule Take 1 capsule (40 mg total) by mouth daily. 90 capsule 3   pravastatin (PRAVACHOL) 40 MG tablet pravastatin 40 mg tablet  Take 0.5 tablets every day by oral route for 90 days. 90 tablet 0   traZODone (DESYREL) 150 MG tablet TAKE 1 TABLET BY MOUTH  DAILY 90 tablet 3   No current facility-administered medications on file prior to visit.   Past Medical History:  Diagnosis Date   Allergy    Arthritis    Cancer ([place]) 2022   left renal cancer, had left nephrectomy   GERD (gastroesophageal reflux disease)    Heart murmur    Hyperlipidemia    Hypertension    Sleep apnea    not currently using cpap   Thyroid disease    Past Surgical History:  Procedure Laterality Date   LAPAROTOMY  1980s   tubal pregnancy, left fallopian tube removal   NEPHRECTOMY Left     Family History  Problem Relation Age of Onset   Alzheimer's disease Mother    Diabetes Mellitus II Mother    Hypertension Father    Stroke Sister    Hypertension Sister    Social History   Socioeconomic History   Marital status: Married    Spouse name: Not on file   Number of children: 1   Years of education: Not on file   Highest education level: Not on file  Occupational History   Occupation: Scientist, water quality  Tobacco Use   Smoking status: Every Day    Packs/day: 0.50    Years: 45.00    Pack years: 22.50    Types: Cigarettes   Smokeless tobacco: Never  Vaping Use   Vaping Use: Never used  Substance and Sexual Activity   Alcohol use: Not Currently   Drug use: Never   Sexual activity: Not on file  Other Topics Concern   Not on file  Social History Narrative   Not on file   Social Determinants of Health   Financial Resource Strain: Not on file  Food Insecurity: Not on file  Transportation Needs: Not on file  Physical Activity: Not on  file  Stress: Not on file  Social Connections: Not on file    Review of Systems  Constitutional:  Negative for chills, fatigue and fever.  HENT:  Negative for congestion, ear pain, rhinorrhea and sore throat.   Respiratory:  Negative for cough and shortness of breath.   Cardiovascular:  Negative for chest pain.  Gastrointestinal:  Negative for abdominal pain, constipation, diarrhea, nausea and vomiting.  Genitourinary:  Positive for dysuria and frequency. Negative for urgency.  Musculoskeletal:  Negative for back pain and myalgias.  Neurological:  Negative for dizziness, weakness, light-headedness and headaches.  Psychiatric/Behavioral:  Negative for dysphoric mood. The patient is not nervous/anxious.     Objective:  BP 120/82    Pulse 75    Temp (!) 97.4 F (36.3 C)    Ht 5' 5" (1.651 m)    Wt 204 lb (92.5 kg)    SpO2 97%    BMI 33.95 kg/m    BP/Weight 10/29/2020 10/01/2020 0/01/7492  Systolic BP 496 759 163  Diastolic BP 78 78 78  Wt. (Lbs) 204 203 203  BMI 33.95 32.77 32.77    Physical Exam Vitals reviewed.  Constitutional:      Appearance: Normal appearance.  HENT:     Head: Normocephalic.     Right Ear: Tympanic membrane normal.     Left Ear: Tympanic membrane normal.     Nose: Nose normal.     Mouth/Throat:     Mouth: Mucous membranes are moist.  Eyes:     Pupils: Pupils are equal, round, and reactive to light.  Cardiovascular:     Rate and Rhythm: Normal rate and regular rhythm.     Pulses: Normal pulses.     Heart sounds: Normal heart sounds.  Pulmonary:     Effort: Pulmonary effort is normal.     Breath sounds: Normal breath sounds.  Abdominal:     General: Bowel sounds are normal.     Palpations: Abdomen is soft.  Musculoskeletal:        General: Normal range of motion.     Cervical back: Neck supple.  Skin:    General: Skin is warm and dry.     Capillary Refill: Capillary refill takes less than 2 seconds.  Neurological:     General: No focal  deficit present.     Mental Status: She is alert and oriented to person, place, and time.  Psychiatric:        Mood and Affect: Mood normal.        Behavior: Behavior normal.        Lab Results  Component Value Date   WBC 5.4 09/04/2020   HGB 12.9 09/04/2020   HCT 38.2 09/04/2020   PLT 270 09/04/2020   GLUCOSE 122 (H) 09/04/2020   CHOL 187 09/04/2020   TRIG 222 (H) 09/04/2020   HDL 44 09/04/2020   LDLCALC 105 (H) 09/04/2020   ALT 13 09/04/2020   AST 19 09/04/2020   NA 138 09/04/2020   K 4.2 09/04/2020   CL 102 09/04/2020   CREATININE 1.26 (H) 09/04/2020   BUN 17 09/04/2020   CO2 23 09/04/2020   TSH 2.510 09/04/2020   HGBA1C 5.7 (H) 09/04/2020      Assessment & Plan:   1. Essential (primary) hypertension - amLODipine (NORVASC) 5 MG tablet; Take 1 tablet (5 mg total) by mouth daily.  Dispense: 30 tablet; Refill: 0 - losartan-hydrochlorothiazide (HYZAAR) 100-25 MG tablet; TAKE 1 TABLET BY MOUTH  DAILY  Dispense: 90 tablet; Refill: 3  2. Mixed hyperlipidemia - Comprehensive metabolic panel - Lipid panel - pravastatin (PRAVACHOL) 40 MG tablet; pravastatin 40 mg tablet  Take 0.5 tablets every day by oral route for 90 days.  Dispense: 90 tablet; Refill: 0  3. Acquired hypothyroidism - levothyroxine (SYNTHROID) 100 MCG tablet; Take 1 tablet (100 mcg total) by mouth  daily before breakfast.  Dispense: 90 tablet; Refill: 3 - TSH  4. Prediabetes - CBC with Differential/Platelet - Comprehensive metabolic panel - Hemoglobin A1c  5. Gastroesophageal reflux disease, unspecified whether esophagitis present - omeprazole (PRILOSEC) 40 MG capsule; Take 1 capsule (40 mg total) by mouth daily.  Dispense: 90 capsule; Refill: 3  6. GAD (generalized anxiety disorder) - TSH  7. Cigarette nicotine dependence with other nicotine-induced disorder - CBC with Differential/Platelet - Comprehensive metabolic panel  8. Insomnia, unspecified type - CBC with Differential/Platelet -  Comprehensive metabolic panel - Suvorexant (BELSOMRA) 10 MG TABS; Take 10 mg by mouth at bedtime as needed.  Dispense: 3 tablet; Refill: 0 - Suvorexant (BELSOMRA) 15 MG TABS; Take 15 mg by mouth at bedtime as needed.  Dispense: 3 tablet; Refill: 0 - TSH  9. Class 1 obesity due to excess calories with serious comorbidity and body mass index (BMI) of 33.0 to 33.9 in adult - CBC with Differential/Platelet - Comprehensive metabolic panel - Lipid panel - Hemoglobin A1c - TSH  10. Personal history of renal cancer - CBC with Differential/Platelet - Comprehensive metabolic panel - Lipid panel - POCT URINALYSIS DIP (CLINITEK)  11. Acute cystitis without hematuria - amoxicillin-clavulanate (AUGMENTIN) 875-125 MG tablet; Take 1 tablet by mouth 2 (two) times daily.  Dispense: 10 tablet; Refill: 0  12. History of candidiasis of vagina - fluconazole (DIFLUCAN) 150 MG tablet; Take 1 tablet (150 mg total) by mouth once for 1 dose.  Dispense: 4 tablet; Refill: 0       Decrease Trazodone to 1/2 tablet for 7 days, then decrease to 1/2 tablet every other day for 7 days, then stop Begin Belsomra 10 mg as needed for insomnia after stopping Trazodone Take Augmentin twice daily for 5 days for UTI Take Diflucan as needed for yeast infection Push fluids, especially water Recommend routine eye exam Follow-up 4-weeks    Follow-up: 4-weeks, pap smear  An After Visit Summary was printed and given to the patient.  I, Rip Harbour, NP, have reviewed all documentation for this visit. The documentation on 03/12/21 for the exam, diagnosis, procedures, and orders are all accurate and complete.    Signed, Rip Harbour, NP [place] [phone]

## 2021-03-12 ENCOUNTER — Encounter: Payer: Self-pay | Admitting: Nurse Practitioner

## 2021-03-12 ENCOUNTER — Ambulatory Visit (INDEPENDENT_AMBULATORY_CARE_PROVIDER_SITE_OTHER): Payer: 59 | Admitting: Nurse Practitioner

## 2021-03-12 ENCOUNTER — Other Ambulatory Visit: Payer: Self-pay

## 2021-03-12 VITALS — BP 120/82 | HR 75 | Temp 97.4°F | Ht 65.0 in | Wt 204.0 lb

## 2021-03-12 DIAGNOSIS — E039 Hypothyroidism, unspecified: Secondary | ICD-10-CM

## 2021-03-12 DIAGNOSIS — Z8619 Personal history of other infectious and parasitic diseases: Secondary | ICD-10-CM

## 2021-03-12 DIAGNOSIS — I10 Essential (primary) hypertension: Secondary | ICD-10-CM | POA: Diagnosis not present

## 2021-03-12 DIAGNOSIS — E782 Mixed hyperlipidemia: Secondary | ICD-10-CM | POA: Diagnosis not present

## 2021-03-12 DIAGNOSIS — G4700 Insomnia, unspecified: Secondary | ICD-10-CM

## 2021-03-12 DIAGNOSIS — Z6833 Body mass index (BMI) 33.0-33.9, adult: Secondary | ICD-10-CM

## 2021-03-12 DIAGNOSIS — K219 Gastro-esophageal reflux disease without esophagitis: Secondary | ICD-10-CM

## 2021-03-12 DIAGNOSIS — R7303 Prediabetes: Secondary | ICD-10-CM | POA: Diagnosis not present

## 2021-03-12 DIAGNOSIS — N3000 Acute cystitis without hematuria: Secondary | ICD-10-CM

## 2021-03-12 DIAGNOSIS — F17218 Nicotine dependence, cigarettes, with other nicotine-induced disorders: Secondary | ICD-10-CM

## 2021-03-12 DIAGNOSIS — Z85528 Personal history of other malignant neoplasm of kidney: Secondary | ICD-10-CM | POA: Diagnosis not present

## 2021-03-12 DIAGNOSIS — F411 Generalized anxiety disorder: Secondary | ICD-10-CM

## 2021-03-12 DIAGNOSIS — E6609 Other obesity due to excess calories: Secondary | ICD-10-CM

## 2021-03-12 LAB — POCT URINALYSIS DIP (CLINITEK)
Blood, UA: NEGATIVE
Glucose, UA: NEGATIVE mg/dL
Ketones, POC UA: NEGATIVE mg/dL
Nitrite, UA: NEGATIVE
POC PROTEIN,UA: 30 — AB
Spec Grav, UA: >=1.03 — AB (ref 1.010–1.025)
Urobilinogen, UA: 0.2 E.U./dL
pH, UA: 6 (ref 5.0–8.0)

## 2021-03-12 MED ORDER — BELSOMRA 10 MG PO TABS: 10.0000 mg | ORAL_TABLET | Freq: Every evening | ORAL | 0 refills | Status: DC | PRN

## 2021-03-12 MED ORDER — LEVOTHYROXINE SODIUM 100 MCG PO TABS: 100.0000 ug | ORAL_TABLET | Freq: Every day | ORAL | 3 refills | Status: DC

## 2021-03-12 MED ORDER — BELSOMRA 15 MG PO TABS: 15.0000 mg | ORAL_TABLET | Freq: Every evening | ORAL | 0 refills | Status: DC | PRN

## 2021-03-12 MED ORDER — AMOXICILLIN-POT CLAVULANATE 875-125 MG PO TABS: 1.0000 | ORAL_TABLET | Freq: Two times a day (BID) | ORAL | 0 refills | Status: DC

## 2021-03-12 MED ORDER — TRAZODONE HCL 150 MG PO TABS: ORAL_TABLET | ORAL | 3 refills | Status: DC

## 2021-03-12 MED ORDER — PRAVASTATIN SODIUM 40 MG PO TABS
ORAL_TABLET | ORAL | 0 refills | Status: DC
Start: 2021-03-12 — End: 2021-04-09

## 2021-03-12 MED ORDER — AMLODIPINE BESYLATE 5 MG PO TABS: 5.0000 mg | ORAL_TABLET | Freq: Every day | ORAL | 0 refills | Status: DC

## 2021-03-12 MED ORDER — FLUCONAZOLE 150 MG PO TABS: 150.0000 mg | ORAL_TABLET | Freq: Once | ORAL | 0 refills | Status: AC

## 2021-03-12 MED ORDER — LOSARTAN POTASSIUM-HCTZ 100-25 MG PO TABS: ORAL_TABLET | ORAL | 3 refills | Status: DC

## 2021-03-12 MED ORDER — OMEPRAZOLE 40 MG PO CPDR: 40.0000 mg | DELAYED_RELEASE_CAPSULE | Freq: Every day | ORAL | 3 refills | Status: DC

## 2021-03-12 NOTE — Patient Instructions (Addendum)
Decrease Trazodone to 1/2 tablet for 7 days, then decrease to 1/2 tablet every other day for 7 days, then stop Begin Belsomra 10 mg as needed for insomnia after stopping Trazodone Take Augmentin twice daily for 5 days for UTI Take Diflucan as needed for yeast infection Push fluids, especially water Recommend routine eye exam Follow-up 4-weeks   Urinary Tract Infection, Adult A urinary tract infection (UTI) is an infection of any part of the urinary tract. The urinary tract includes: The kidneys. The ureters. The bladder. The urethra. These organs make, store, and get rid of pee (urine) in the body. What are the causes? This infection is caused by germs (bacteria) in your genital area. These germs grow and cause swelling (inflammation) of your urinary tract. What increases the risk? The following factors may make you more likely to develop this condition: Using a small, thin tube (catheter) to drain pee. Not being able to control when you pee or poop (incontinence). Being female. If you are female, these things can increase the risk: Using these methods to prevent pregnancy: A medicine that kills sperm (spermicide). A device that blocks sperm (diaphragm). Having low levels of a female hormone (estrogen). Being pregnant. You are more likely to develop this condition if: You have genes that add to your risk. You are sexually active. You take antibiotic medicines. You have trouble peeing because of: A prostate that is bigger than normal, if you are female. A blockage in the part of your body that drains pee from the bladder. A kidney stone. A nerve condition that affects your bladder. Not getting enough to drink. Not peeing often enough. You have other conditions, such as: Diabetes. A weak disease-fighting system (immune system). Sickle cell disease. Gout. Injury of the spine. What are the signs or symptoms? Symptoms of this condition include: Needing to pee right  away. Peeing small amounts often. Pain or burning when peeing. Blood in the pee. Pee that smells bad or not like normal. Trouble peeing. Pee that is cloudy. Fluid coming from the vagina, if you are female. Pain in the belly or lower back. Other symptoms include: Vomiting. Not feeling hungry. Feeling mixed up (confused). This may be the first symptom in older adults. Being tired and grouchy (irritable). A fever. Watery poop (diarrhea). How is this treated? Taking antibiotic medicine. Taking other medicines. Drinking enough water. In some cases, you may need to see a specialist. Follow these instructions at home: Medicines Take over-the-counter and prescription medicines only as told by your doctor. If you were prescribed an antibiotic medicine, take it as told by your doctor. Do not stop taking it even if you start to feel better. General instructions Make sure you: Pee until your bladder is empty. Do not hold pee for a long time. Empty your bladder after sex. Wipe from front to back after peeing or pooping if you are a female. Use each tissue one time when you wipe. Drink enough fluid to keep your pee pale yellow. Keep all follow-up visits. Contact a doctor if: You do not get better after 1-2 days. Your symptoms go away and then come back. Get help right away if: You have very bad back pain. You have very bad pain in your lower belly. You have a fever. You have chills. You feeling like you will vomit or you vomit. Summary A urinary tract infection (UTI) is an infection of any part of the urinary tract. This condition is caused by germs in your genital area. There  are many risk factors for a UTI. Treatment includes antibiotic medicines. Drink enough fluid to keep your pee pale yellow. This information is not intended to replace advice given to you by your health care provider. Make sure you discuss any questions you have with your health care provider. Document  Revised: 08/17/2019 Document Reviewed: 08/17/2019 Elsevier Patient Education  2022 [place].   Insomnia Insomnia is a sleep disorder that makes it difficult to fall asleep or stay asleep. Insomnia can cause fatigue, low energy, difficulty concentrating, mood swings, and poor performance at work or school. There are three different ways to classify insomnia: Difficulty falling asleep. Difficulty staying asleep. Waking up too early in the morning. Any type of insomnia can be long-term (chronic) or short-term (acute). Both are common. Short-term insomnia usually lasts for three months or less. Chronic insomnia occurs at least three times a week for longer than three months. What are the causes? Insomnia may be caused by another condition, situation, or substance, such as: Anxiety. Certain medicines. Gastroesophageal reflux disease (GERD) or other gastrointestinal conditions. Asthma or other breathing conditions. Restless legs syndrome, sleep apnea, or other sleep disorders. Chronic pain. Menopause. Stroke. Abuse of alcohol, tobacco, or illegal drugs. Mental health conditions, such as depression. Caffeine. Neurological disorders, such as Alzheimer's disease. An overactive thyroid (hyperthyroidism). Sometimes, the cause of insomnia may not be known. What increases the risk? Risk factors for insomnia include: Gender. Women are affected more often than men. Age. Insomnia is more common as you get older. Stress. Lack of exercise. Irregular work schedule or working night shifts. Traveling between different time zones. Certain medical and mental health conditions. What are the signs or symptoms? If you have insomnia, the main symptom is having trouble falling asleep or having trouble staying asleep. This may lead to other symptoms, such as: Feeling fatigued or having low energy. Feeling nervous about going to sleep. Not feeling rested in the morning. Having trouble  concentrating. Feeling irritable, anxious, or depressed. How is this diagnosed? This condition may be diagnosed based on: Your symptoms and medical history. Your health care provider may ask about: Your sleep habits. Any medical conditions you have. Your mental health. A physical exam. How is this treated? Treatment for insomnia depends on the cause. Treatment may focus on treating an underlying condition that is causing insomnia. Treatment may also include: Medicines to help you sleep. Counseling or therapy. Lifestyle adjustments to help you sleep better. Follow these instructions at home: Eating and drinking  Limit or avoid alcohol, caffeinated beverages, and cigarettes, especially close to bedtime. These can disrupt your sleep. Do not eat a large meal or eat spicy foods right before bedtime. This can lead to digestive discomfort that can make it hard for you to sleep. Sleep habits  Keep a sleep diary to help you and your health care provider figure out what could be causing your insomnia. Write down: When you sleep. When you wake up during the night. How well you sleep. How rested you feel the next day. Any side effects of medicines you are taking. What you eat and drink. Make your bedroom a dark, comfortable place where it is easy to fall asleep. Put up shades or blackout curtains to block light from outside. Use a white noise machine to block noise. Keep the temperature cool. Limit screen use before bedtime. This includes: Watching TV. Using your smartphone, tablet, or computer. Stick to a routine that includes going to bed and waking up at the same times  every day and night. This can help you fall asleep faster. Consider making a quiet activity, such as reading, part of your nighttime routine. Try to avoid taking naps during the day so that you sleep better at night. Get out of bed if you are still awake after 15 minutes of trying to sleep. Keep the lights down, but try  reading or doing a quiet activity. When you feel sleepy, go back to bed. General instructions Take over-the-counter and prescription medicines only as told by your health care provider. Exercise regularly, as told by your health care provider. Avoid exercise starting several hours before bedtime. Use relaxation techniques to manage stress. Ask your health care provider to suggest some techniques that may work well for you. These may include: Breathing exercises. Routines to release muscle tension. Visualizing peaceful scenes. Make sure that you drive carefully. Avoid driving if you feel very sleepy. Keep all follow-up visits as told by your health care provider. This is important. Contact a health care provider if: You are tired throughout the day. You have trouble in your daily routine due to sleepiness. You continue to have sleep problems, or your sleep problems get worse. Get help right away if: You have serious thoughts about hurting yourself or someone else. If you ever feel like you may hurt yourself or others, or have thoughts about taking your own life, get help right away. You can go to your nearest emergency department or call: Your local emergency services (911 in the [place] A suicide crisis helpline, such as the [facility] at 929-741-0694 or 988 in the [place]. This is open 24 hours a day. Summary Insomnia is a sleep disorder that makes it difficult to fall asleep or stay asleep. Insomnia can be long-term (chronic) or short-term (acute). Treatment for insomnia depends on the cause. Treatment may focus on treating an underlying condition that is causing insomnia. Keep a sleep diary to help you and your health care provider figure out what could be causing your insomnia. This information is not intended to replace advice given to you by your health care provider. Make sure you discuss any questions you have with your health care provider. Document Revised:  07/30/2020 Document Reviewed: 11/15/2019 Elsevier Patient Education  2022 [place] Sleep Information, Adult Quality sleep is important for your mental and physical health. It also improves your quality of life. Quality sleep means you: Are asleep for most of the time you are in bed. Fall asleep within 30 minutes. Wake up no more than once a night.  Are awake for no longer than 20 minutes if you do wake up during the night. Most adults need 7-8 hours of quality sleep each night. How can poor sleep affect me? If you do not get enough quality sleep, you may have: Mood swings. Daytime sleepiness. Confusion. Decreased reaction time. Sleep disorders, such as insomnia and sleep apnea. Difficulty with: Solving problems. Coping with stress. Paying attention. These issues may affect your performance and productivity at work, school, and at home. Lack of sleep may also put you at higher risk for accidents, suicide, and risky behaviors. If you do not get quality sleep you may also be at higher risk for several health problems, including: Infections. Type 2 diabetes. Heart disease. High blood pressure. Obesity. Worsening of long-term conditions, like arthritis, kidney disease, depression, Parkinson's disease, and epilepsy. What actions can I take to get more quality sleep?   Stick to a sleep schedule. Go  to sleep and wake up at about the same time each day. Do not try to sleep less on weekdays and make up for lost sleep on weekends. This does not work. Try to get about 30 minutes of exercise on most days. Do not exercise 2-3 hours before going to bed. Limit naps during the day to 30 minutes or less. Do not use any products that contain nicotine or tobacco, such as cigarettes or e-cigarettes. If you need help quitting, ask your health care provider. Do not drink caffeinated beverages for at least 8 hours before going to bed. Coffee, tea, and some sodas contain caffeine. Do  not drink alcohol close to bedtime. Do not eat large meals close to bedtime. Do not take naps in the late afternoon. Try to get at least 30 minutes of sunlight every day. Morning sunlight is best. Make time to relax before bed. Reading, listening to music, or taking a hot bath promotes quality sleep. Make your bedroom a place that promotes quality sleep. Keep your bedroom dark, quiet, and at a comfortable room temperature. Make sure your bed is comfortable. Take out sleep distractions like TV, a computer, smartphone, and bright lights. If you are lying awake in bed for longer than 20 minutes, get up and do a relaxing activity until you feel sleepy. Work with your health care provider to treat medical conditions that may affect sleeping, such as: Nasal obstruction. Snoring. Sleep apnea and other sleep disorders. Talk to your health care provider if you think any of your prescription medicines may cause you to have difficulty falling or staying asleep. If you have sleep problems, talk with a sleep consultant. If you think you have a sleep disorder, talk with your health care provider about getting evaluated by a specialist. Where to find more information [place] website: https://sleepfoundation.org National Heart, Lung, and [place] ([place]): http://www.saunders.info/.pdf Centers for Disease Control and Prevention (CDC): LearningDermatology.pl Contact a health care provider if you: Have trouble getting to sleep or staying asleep. Often wake up very early in the morning and cannot get back to sleep. Have daytime sleepiness. Have daytime sleep attacks of suddenly falling asleep and sudden muscle weakness (narcolepsy). Have a tingling sensation in your legs with a strong urge to move your legs (restless legs syndrome). Stop breathing briefly during sleep (sleep apnea). Think you have a sleep disorder or are taking a medicine that is  affecting your quality of sleep. Summary Most adults need 7-8 hours of quality sleep each night. Getting enough quality sleep is an important part of health and well-being. Make your bedroom a place that promotes quality sleep and avoid things that may cause you to have poor sleep, such as alcohol, caffeine, smoking, and large meals. Talk to your health care provider if you have trouble falling asleep or staying asleep. This information is not intended to replace advice given to you by your health care provider. Make sure you discuss any questions you have with your health care provider. Document Revised: 04/13/2017 Document Reviewed: 04/13/2017 Elsevier Patient Education  2022 [place].   Urinary Tract Infection, Adult A urinary tract infection (UTI) is an infection of any part of the urinary tract. The urinary tract includes: The kidneys. The ureters. The bladder. The urethra. These organs make, store, and get rid of pee (urine) in the body. What are the causes? This infection is caused by germs (bacteria) in your genital area. These germs grow and cause swelling (inflammation) of your urinary tract.  What increases the risk? The following factors may make you more likely to develop this condition: Using a small, thin tube (catheter) to drain pee. Not being able to control when you pee or poop (incontinence). Being female. If you are female, these things can increase the risk: Using these methods to prevent pregnancy: A medicine that kills sperm (spermicide). A device that blocks sperm (diaphragm). Having low levels of a female hormone (estrogen). Being pregnant. You are more likely to develop this condition if: You have genes that add to your risk. You are sexually active. You take antibiotic medicines. You have trouble peeing because of: A prostate that is bigger than normal, if you are female. A blockage in the part of your body that drains pee from the bladder. A kidney  stone. A nerve condition that affects your bladder. Not getting enough to drink. Not peeing often enough. You have other conditions, such as: Diabetes. A weak disease-fighting system (immune system). Sickle cell disease. Gout. Injury of the spine. What are the signs or symptoms? Symptoms of this condition include: Needing to pee right away. Peeing small amounts often. Pain or burning when peeing. Blood in the pee. Pee that smells bad or not like normal. Trouble peeing. Pee that is cloudy. Fluid coming from the vagina, if you are female. Pain in the belly or lower back. Other symptoms include: Vomiting. Not feeling hungry. Feeling mixed up (confused). This may be the first symptom in older adults. Being tired and grouchy (irritable). A fever. Watery poop (diarrhea). How is this treated? Taking antibiotic medicine. Taking other medicines. Drinking enough water. In some cases, you may need to see a specialist. Follow these instructions at home: Medicines Take over-the-counter and prescription medicines only as told by your doctor. If you were prescribed an antibiotic medicine, take it as told by your doctor. Do not stop taking it even if you start to feel better. General instructions Make sure you: Pee until your bladder is empty. Do not hold pee for a long time. Empty your bladder after sex. Wipe from front to back after peeing or pooping if you are a female. Use each tissue one time when you wipe. Drink enough fluid to keep your pee pale yellow. Keep all follow-up visits. Contact a doctor if: You do not get better after 1-2 days. Your symptoms go away and then come back. Get help right away if: You have very bad back pain. You have very bad pain in your lower belly. You have a fever. You have chills. You feeling like you will vomit or you vomit. Summary A urinary tract infection (UTI) is an infection of any part of the urinary tract. This condition is caused by  germs in your genital area. There are many risk factors for a UTI. Treatment includes antibiotic medicines. Drink enough fluid to keep your pee pale yellow. This information is not intended to replace advice given to you by your health care provider. Make sure you discuss any questions you have with your health care provider. Document Revised: 08/17/2019 Document Reviewed: 08/17/2019 Elsevier Patient Education  [place].

## 2021-03-13 LAB — CBC WITH DIFFERENTIAL/PLATELET
Basophils Absolute: 0.1 10*3/uL (ref 0.0–0.2)
Basos: 1 %
EOS (ABSOLUTE): 0.1 10*3/uL (ref 0.0–0.4)
Eos: 1 %
Hematocrit: 39 % (ref 34.0–46.6)
Hemoglobin: 12.8 g/dL (ref 11.1–15.9)
Immature Grans (Abs): 0 10*3/uL (ref 0.0–0.1)
Immature Granulocytes: 1 %
Lymphocytes Absolute: 2.1 10*3/uL (ref 0.7–3.1)
Lymphs: 32 %
MCH: 30.3 pg (ref 26.6–33.0)
MCHC: 32.8 g/dL (ref 31.5–35.7)
MCV: 92 fL (ref 79–97)
Monocytes Absolute: 0.4 10*3/uL (ref 0.1–0.9)
Monocytes: 6 %
Neutrophils Absolute: 4 10*3/uL (ref 1.4–7.0)
Neutrophils: 59 %
Platelets: 283 10*3/uL (ref 150–450)
RBC: 4.23 x10E6/uL (ref 3.77–5.28)
RDW: 13 % (ref 11.7–15.4)
WBC: 6.6 10*3/uL (ref 3.4–10.8)

## 2021-03-13 LAB — COMPREHENSIVE METABOLIC PANEL
ALT: 17 IU/L (ref 0–32)
AST: 21 IU/L (ref 0–40)
Albumin/Globulin Ratio: 1.5 (ref 1.2–2.2)
Albumin: 4.3 g/dL (ref 3.8–4.8)
Alkaline Phosphatase: 107 IU/L (ref 44–121)
BUN/Creatinine Ratio: 15 (ref 12–28)
BUN: 25 mg/dL (ref 8–27)
Bilirubin Total: 0.5 mg/dL (ref 0.0–1.2)
CO2: 22 mmol/L (ref 20–29)
Calcium: 9.6 mg/dL (ref 8.7–10.3)
Chloride: 99 mmol/L (ref 96–106)
Creatinine, Ser: 1.64 mg/dL — ABNORMAL HIGH (ref 0.57–1.00)
Globulin, Total: 2.8 g/dL (ref 1.5–4.5)
Glucose: 126 mg/dL — ABNORMAL HIGH (ref 70–99)
Potassium: 4 mmol/L (ref 3.5–5.2)
Sodium: 138 mmol/L (ref 134–144)
Total Protein: 7.1 g/dL (ref 6.0–8.5)
eGFR: 35 mL/min/{1.73_m2} — ABNORMAL LOW (ref >59)

## 2021-03-13 LAB — LIPID PANEL
Chol/HDL Ratio: 3.4 ratio (ref 0.0–4.4)
Cholesterol, Total: 181 mg/dL (ref 100–199)
HDL: 53 mg/dL (ref >39)
LDL Chol Calc (NIH): 108 mg/dL — ABNORMAL HIGH (ref 0–99)
Triglycerides: 111 mg/dL (ref 0–149)
VLDL Cholesterol Cal: 20 mg/dL (ref 5–40)

## 2021-03-13 LAB — HEMOGLOBIN A1C
Est. average glucose Bld gHb Est-mCnc: 117 mg/dL
Hgb A1c MFr Bld: 5.7 % — ABNORMAL HIGH (ref 4.8–5.6)

## 2021-03-13 LAB — CARDIOVASCULAR RISK ASSESSMENT

## 2021-03-16 LAB — TSH: TSH: 2.9 u[IU]/mL (ref 0.450–4.500)

## 2021-03-16 LAB — SPECIMEN STATUS REPORT

## 2021-03-24 ENCOUNTER — Other Ambulatory Visit: Payer: Self-pay

## 2021-03-24 DIAGNOSIS — N1831 Chronic kidney disease, stage 3a: Secondary | ICD-10-CM

## 2021-03-24 DIAGNOSIS — I10 Essential (primary) hypertension: Secondary | ICD-10-CM

## 2021-03-24 DIAGNOSIS — N289 Disorder of kidney and ureter, unspecified: Secondary | ICD-10-CM

## 2021-03-24 MED ORDER — VALSARTAN 160 MG PO TABS: 160.0000 mg | ORAL_TABLET | Freq: Every day | ORAL | 0 refills | Status: DC

## 2021-04-09 ENCOUNTER — Encounter: Payer: Self-pay | Admitting: Nurse Practitioner

## 2021-04-09 ENCOUNTER — Other Ambulatory Visit: Payer: Self-pay | Admitting: Nurse Practitioner

## 2021-04-09 ENCOUNTER — Ambulatory Visit (INDEPENDENT_AMBULATORY_CARE_PROVIDER_SITE_OTHER): Payer: 59 | Admitting: Nurse Practitioner

## 2021-04-09 ENCOUNTER — Other Ambulatory Visit: Payer: Self-pay

## 2021-04-09 VITALS — BP 150/90 | HR 72 | Temp 97.5°F | Ht 65.0 in | Wt 206.0 lb

## 2021-04-09 DIAGNOSIS — E782 Mixed hyperlipidemia: Secondary | ICD-10-CM

## 2021-04-09 DIAGNOSIS — I10 Essential (primary) hypertension: Secondary | ICD-10-CM

## 2021-04-09 DIAGNOSIS — Z124 Encounter for screening for malignant neoplasm of cervix: Secondary | ICD-10-CM | POA: Diagnosis not present

## 2021-04-09 DIAGNOSIS — G4700 Insomnia, unspecified: Secondary | ICD-10-CM

## 2021-04-09 DIAGNOSIS — Z0000 Encounter for general adult medical examination without abnormal findings: Secondary | ICD-10-CM

## 2021-04-09 MED ORDER — PRAVASTATIN SODIUM 40 MG PO TABS: ORAL_TABLET | ORAL | 1 refills | Status: DC

## 2021-04-09 MED ORDER — AMLODIPINE BESYLATE 5 MG PO TABS: 5.0000 mg | ORAL_TABLET | Freq: Every day | ORAL | 1 refills | Status: DC

## 2021-04-09 MED ORDER — BELSOMRA 15 MG PO TABS: 15.0000 mg | ORAL_TABLET | Freq: Every evening | ORAL | 0 refills | Status: DC | PRN

## 2021-04-09 NOTE — Patient Instructions (Addendum)
Continue medications ?We will call you with pap smear results ?Follow-up in 65-month, fasting ?Take Belsomra 15 mg as needed for sleep at night ? ?Insomnia ?Insomnia is a sleep disorder that makes it difficult to fall asleep or stay asleep. Insomnia can cause fatigue, low energy, difficulty concentrating, mood swings, and poor performance at work or school. ?There are three different ways to classify insomnia: ?Difficulty falling asleep. ?Difficulty staying asleep. ?Waking up too early in the morning. ?Any type of insomnia can be long-term (chronic) or short-term (acute). Both are common. Short-term insomnia usually lasts for three months or less. Chronic insomnia occurs at least three times a week for longer than three months. ?What are the causes? ?Insomnia may be caused by another condition, situation, or substance, such as: ?Anxiety. ?Certain medicines. ?Gastroesophageal reflux disease (GERD) or other gastrointestinal conditions. ?Asthma or other breathing conditions. ?Restless legs syndrome, sleep apnea, or other sleep disorders. ?Chronic pain. ?Menopause. ?Stroke. ?Abuse of alcohol, tobacco, or illegal drugs. ?Mental health conditions, such as depression. ?Caffeine. ?Neurological disorders, such as Alzheimer's disease. ?An overactive thyroid (hyperthyroidism). ?Sometimes, the cause of insomnia may not be known. ?What increases the risk? ?Risk factors for insomnia include: ?Gender. Women are affected more often than men. ?Age. Insomnia is more common as you get older. ?Stress. ?Lack of exercise. ?Irregular work schedule or working night shifts. ?Traveling between different time zones. ?Certain medical and mental health conditions. ?What are the signs or symptoms? ?If you have insomnia, the main symptom is having trouble falling asleep or having trouble staying asleep. This may lead to other symptoms, such as: ?Feeling fatigued or having low energy. ?Feeling nervous about going to sleep. ?Not feeling rested in  the morning. ?Having trouble concentrating. ?Feeling irritable, anxious, or depressed. ?How is this diagnosed? ?This condition may be diagnosed based on: ?Your symptoms and medical history. Your health care provider may ask about: ?Your sleep habits. ?Any medical conditions you have. ?Your mental health. ?A physical exam. ?How is this treated? ?Treatment for insomnia depends on the cause. Treatment may focus on treating an underlying condition that is causing insomnia. Treatment may also include: ?Medicines to help you sleep. ?Counseling or therapy. ?Lifestyle adjustments to help you sleep better. ?Follow these instructions at home: ?Eating and drinking ? ?Limit or avoid alcohol, caffeinated beverages, and cigarettes, especially close to bedtime. These can disrupt your sleep. ?Do not eat a large meal or eat spicy foods right before bedtime. This can lead to digestive discomfort that can make it hard for you to sleep. ?Sleep habits ? ?Keep a sleep diary to help you and your health care provider figure out what could be causing your insomnia. Write down: ?When you sleep. ?When you wake up during the night. ?How well you sleep. ?How rested you feel the next day. ?Any side effects of medicines you are taking. ?What you eat and drink. ?Make your bedroom a dark, comfortable place where it is easy to fall asleep. ?Put up shades or blackout curtains to block light from outside. ?Use a white noise machine to block noise. ?Keep the temperature cool. ?Limit screen use before bedtime. This includes: ?Watching TV. ?Using your smartphone, tablet, or computer. ?Stick to a routine that includes going to bed and waking up at the same times every day and night. This can help you fall asleep faster. Consider making a quiet activity, such as reading, part of your nighttime routine. ?Try to avoid taking naps during the day so that you sleep better at night. ?Get  out of bed if you are still awake after 15 minutes of trying to sleep. Keep  the lights down, but try reading or doing a quiet activity. When you feel sleepy, go back to bed. ?General instructions ?Take over-the-counter and prescription medicines only as told by your health care provider. ?Exercise regularly, as told by your health care provider. Avoid exercise starting several hours before bedtime. ?Use relaxation techniques to manage stress. Ask your health care provider to suggest some techniques that may work well for you. These may include: ?Breathing exercises. ?Routines to release muscle tension. ?Visualizing peaceful scenes. ?Make sure that you drive carefully. Avoid driving if you feel very sleepy. ?Keep all follow-up visits as told by your health care provider. This is important. ?Contact a health care provider if: ?You are tired throughout the day. ?You have trouble in your daily routine due to sleepiness. ?You continue to have sleep problems, or your sleep problems get worse. ?Get help right away if: ?You have serious thoughts about hurting yourself or someone else. ?If you ever feel like you may hurt yourself or others, or have thoughts about taking your own life, get help right away. You can go to your nearest emergency department or call: ?Your local emergency services (911 in the [place] ?A suicide crisis helpline, such as the [place] at (616)406-2915 or 988 in the [place]. This is open 24 hours a day. ?Summary ?Insomnia is a sleep disorder that makes it difficult to fall asleep or stay asleep. ?Insomnia can be long-term (chronic) or short-term (acute). ?Treatment for insomnia depends on the cause. Treatment may focus on treating an underlying condition that is causing insomnia. ?Keep a sleep diary to help you and your health care provider figure out what could be causing your insomnia. ?This information is not intended to replace advice given to you by your health care provider. Make sure you discuss any questions you have with your health care  provider. ?Document Revised: 07/30/2020 Document Reviewed: 11/15/2019 ?Elsevier Patient Education ? [place]. ?Quality Sleep Information, Adult ?Quality sleep is important for your mental and physical health. It also improves your quality of life. Quality sleep means you: ?Are asleep for most of the time you are in bed. ?Fall asleep within 30 minutes. ?Wake up no more than once a night.  ?Are awake for no longer than 20 minutes if you do wake up during the night. ?Most adults need 7-8 hours of quality sleep each night. ?How can poor sleep affect me? ?If you do not get enough quality sleep, you may have: ?Mood swings. ?Daytime sleepiness. ?Confusion. ?Decreased reaction time. ?Sleep disorders, such as insomnia and sleep apnea. ?Difficulty with: ?Solving problems. ?Coping with stress. ?Paying attention. ?These issues may affect your performance and productivity at work, school, and at home. Lack of sleep may also put you at higher risk for accidents, suicide, and risky behaviors. ?If you do not get quality sleep you may also be at higher risk for several health problems, including: ?Infections. ?Type 2 diabetes. ?Heart disease. ?High blood pressure. ?Obesity. ?Worsening of long-term conditions, like arthritis, kidney disease, depression, Parkinson's disease, and epilepsy. ?What actions can I take to get more quality sleep? ?  ?Stick to a sleep schedule. Go to sleep and wake up at about the same time each day. Do not try to sleep less on weekdays and make up for lost sleep on weekends. This does not work. ?Try to get about 30 minutes of exercise on most days.  Do not exercise 2-3 hours before going to bed. ?Limit naps during the day to 30 minutes or less. ?Do not use any products that contain nicotine or tobacco, such as cigarettes or e-cigarettes. If you need help quitting, ask your health care provider. ?Do not drink caffeinated beverages for at least 8 hours before going to bed. Coffee, tea, and some sodas  contain caffeine. ?Do not drink alcohol close to bedtime. ?Do not eat large meals close to bedtime. ?Do not take naps in the late afternoon. ?Try to get at least 30 minutes of sunlight every day. Morning su

## 2021-04-09 NOTE — Progress Notes (Signed)
? ?Subjective:  ?Patient ID: Sandy Beck, female    DOB: 12-27-1960  Age: 61 y.o. MRN: [id number] ? ?Chief Complaint  ?Patient presents with  ? Annual Exam  ? ? ?HPI ?Encounter for general adult medical examination without abnormal findings  ?Physical ("At Risk" items are starred): Patient's last physical exam was 1 year ago .  ? ?   ?SDOH Screenings  ? ?Alcohol Screen: Not on file  ?Depression (PHQ2-9): Low Risk   ? PHQ-2 Score: 0  ?Financial Resource Strain: Not on file  ?Food Insecurity: Not on file  ?Housing: Not on file  ?Physical Activity: Not on file  ?Social Connections: Not on file  ?Stress: Not on file  ?Tobacco Use: High Risk  ? Smoking Tobacco Use: Every Day  ? Smokeless Tobacco Use: Never  ? Passive Exposure: Not on file  ?Transportation Needs: Not on file  ?  ? ?  03/12/2021  ? 10:19 AM 06/03/2020  ?  2:20 PM  ?Fall Risk   ?Falls in the past year? 0 0  ?Number falls in past yr: 0 0  ?Injury with Fall? 0 0  ?Follow up  Falls evaluation completed  ?  ? ?  03/12/2021  ? 10:19 AM 10/29/2020  ? 11:16 AM 10/01/2020  ? 11:07 AM 06/03/2020  ?  2:21 PM  ?Depression screen PHQ 2/9  ?Decreased Interest 0 0 1 0  ?Down, Depressed, Hopeless 0 0 0 0  ?PHQ - 2 Score 0 0 1 0  ?Altered sleeping  0 1   ?Tired, decreased energy  3 3   ?Change in appetite  0 1   ?Feeling bad or failure about yourself   0 0   ?Trouble concentrating  0 0   ?Moving slowly or fidgety/restless  0 0   ?Suicidal thoughts  0 0   ?PHQ-9 Score  3 6   ?Difficult doing work/chores  Not difficult at all Not difficult at all   ?  ?Functional Status Survey: ?   ? ?Safety: reviewed ;  ?Patient wears a seat belt. ?Patient's home has smoke detectors and carbon monoxide detectors. ?Patient practices appropriate gun safety ?Patient wears sunscreen with extended sun exposure. ?Dental Care: biannual cleanings, brushes and flosses daily. ?Ophthalmology/Optometry: no  ?Hearing loss: none ?Vision impairments: none ?Patient is not afflicted from Stress Incontinence  and Urge Incontinence  ? ?Current Outpatient Medications on File Prior to Visit  ?Medication Sig Dispense Refill  ? amLODipine (NORVASC) 5 MG tablet Take 1 tablet (5 mg total) by mouth daily. 30 tablet 0  ? amoxicillin-clavulanate (AUGMENTIN) 875-125 MG tablet Take 1 tablet by mouth 2 (two) times daily. 10 tablet 0  ? estradiol (ESTRACE VAGINAL) 0.1 MG/GM vaginal cream Apply small pea-size amount to vaginal area nightly for 14 days, then use twice weekly 42.5 g 12  ? levothyroxine (SYNTHROID) 100 MCG tablet Take 1 tablet (100 mcg total) by mouth daily before breakfast. 90 tablet 3  ? omeprazole (PRILOSEC) 40 MG capsule Take 1 capsule (40 mg total) by mouth daily. 90 capsule 3  ? pravastatin (PRAVACHOL) 40 MG tablet pravastatin 40 mg tablet  Take 0.5 tablets every day by oral route for 90 days. 90 tablet 0  ? Suvorexant (BELSOMRA) 10 MG TABS Take 10 mg by mouth at bedtime as needed. 3 tablet 0  ? Suvorexant (BELSOMRA) 15 MG TABS Take 15 mg by mouth at bedtime as needed. 3 tablet 0  ? valsartan (DIOVAN) 160 MG tablet Take 1 tablet (160 mg total)  by mouth daily. 90 tablet 0  ? ?No current facility-administered medications on file prior to visit.  ?  ?Social Hx   ?Social History  ? ?Socioeconomic History  ? Marital status: Married  ?  Spouse name: Not on file  ? Number of children: 1  ? Years of education: Not on file  ? Highest education level: Not on file  ?Occupational History  ? Occupation: CASHIER  ?Tobacco Use  ? Smoking status: Every Day  ?  Packs/day: 0.50  ?  Years: 45.00  ?  Pack years: 22.50  ?  Types: Cigarettes  ? Smokeless tobacco: Never  ?Vaping Use  ? Vaping Use: Never used  ?Substance and Sexual Activity  ? Alcohol use: Not Currently  ? Drug use: Never  ? Sexual activity: Not on file  ?Other Topics Concern  ? Not on file  ?Social History Narrative  ? Not on file  ? ?Social Determinants of Health  ? ?Financial Resource Strain: Not on file  ?Food Insecurity: Not on file  ?Transportation Needs: Not on file   ?Physical Activity: Not on file  ?Stress: Not on file  ?Social Connections: Not on file  ? ?Past Medical History:  ?Diagnosis Date  ? Allergy   ? Arthritis   ? Cancer [facility]) 2022  ? left renal cancer, had left nephrectomy  ? GERD (gastroesophageal reflux disease)   ? Heart murmur   ? Hyperlipidemia   ? Hypertension   ? Sleep apnea   ? not currently using cpap  ? Thyroid disease   ? ?Family History  ?Problem Relation Age of Onset  ? Alzheimer's disease Mother   ? Diabetes Mellitus II Mother   ? Hypertension Father   ? Stroke Sister   ? Hypertension Sister   ? ? ?Review of Systems  ?Constitutional:  Negative for appetite change, chills, fatigue and fever.  ?HENT:  Negative for congestion, ear discharge, ear pain, rhinorrhea, sinus pressure, sneezing and sore throat.   ?Eyes:  Negative for visual disturbance.  ?Respiratory:  Negative for cough, chest tightness, shortness of breath and wheezing.   ?Cardiovascular:  Negative for chest pain, palpitations and leg swelling.  ?Gastrointestinal:  Negative for abdominal pain, diarrhea, nausea and vomiting.  ?Endocrine: Negative for polydipsia, polyphagia and polyuria.  ?Genitourinary:  Negative for difficulty urinating, dysuria, frequency, hematuria, menstrual problem, urgency, vaginal bleeding, vaginal discharge and vaginal pain.  ?Musculoskeletal:  Negative for back pain, gait problem, joint swelling, myalgias and neck pain.  ?Neurological:  Negative for dizziness, seizures, syncope, weakness, numbness and headaches.  ?Psychiatric/Behavioral:  Positive for sleep disturbance (insomnia). Negative for agitation, confusion, hallucinations and suicidal ideas. The patient is not nervous/anxious.   ? ? ?Objective:  ?BP (!) 150/90   Pulse 72   Temp (!) 97.5 ?F (36.4 ?C)   Ht '5\' 5"'$  (1.651 m)   Wt 206 lb (93.4 kg)   SpO2 98%   BMI 34.28 kg/m?   ? ? ?  03/12/2021  ? 10:15 AM 10/29/2020  ? 11:13 AM 10/01/2020  ? 10:52 AM  ?BP/Weight  ?Systolic BP 161 096 045  ?Diastolic BP 82 78 78   ?Wt. (Lbs) 204 204 203  ?BMI 33.95 kg/m2 33.95 kg/m2 32.77 kg/m2  ? ? ?Physical Exam ?Vitals reviewed.  ?Constitutional:   ?   Appearance: Normal appearance.  ?HENT:  ?   Head: Normocephalic.  ?   Right Ear: Tympanic membrane normal.  ?   Left Ear: Tympanic membrane normal.  ?   Nose:  Nose normal.  ?   Mouth/Throat:  ?   Mouth: Mucous membranes are moist.  ?Eyes:  ?   Pupils: Pupils are equal, round, and reactive to light.  ?Cardiovascular:  ?   Rate and Rhythm: Normal rate and regular rhythm.  ?   Pulses: Normal pulses.  ?   Heart sounds: Normal heart sounds.  ?Pulmonary:  ?   Effort: Pulmonary effort is normal.  ?   Breath sounds: Normal breath sounds.  ?Abdominal:  ?   General: Bowel sounds are normal.  ?   Palpations: Abdomen is soft.  ?Genitourinary: ?   General: Normal vulva.  ?   Vagina: Vaginal discharge present.  ?Musculoskeletal:     ?   General: Normal range of motion.  ?   Cervical back: Neck supple.  ?Skin: ?   General: Skin is warm and dry.  ?   Capillary Refill: Capillary refill takes less than 2 seconds.  ?Neurological:  ?   General: No focal deficit present.  ?   Mental Status: She is alert and oriented to person, place, and time.  ?Psychiatric:     ?   Mood and Affect: Mood normal.     ?   Behavior: Behavior normal.  ? ? ?Lab Results  ?Component Value Date  ? WBC 6.6 03/12/2021  ? HGB 12.8 03/12/2021  ? HCT 39.0 03/12/2021  ? PLT 283 03/12/2021  ? GLUCOSE 126 (H) 03/12/2021  ? CHOL 181 03/12/2021  ? TRIG 111 03/12/2021  ? HDL 53 03/12/2021  ? LDLCALC 108 (H) 03/12/2021  ? ALT 17 03/12/2021  ? AST 21 03/12/2021  ? NA 138 03/12/2021  ? K 4.0 03/12/2021  ? CL 99 03/12/2021  ? CREATININE 1.64 (H) 03/12/2021  ? BUN 25 03/12/2021  ? CO2 22 03/12/2021  ? TSH 2.900 03/12/2021  ? HGBA1C 5.7 (H) 03/12/2021  ? ? ? ? ?Assessment & Plan:  ?  ?1. Physical exam, annual ? ?2. Encounter for screening for cervical cancer ?- IGP, Aptima HPV, rfx 16/18,45 ?   ? ? ? ?These are the goals we discussed: ? Goals    ?Decrease insomnia ? ?  ?  ? ?This is a list of the screening recommended for you and due dates:  ?Health Maintenance  ?Topic Date Due  ? HIV Screening  Never done  ? Hepatitis C Screening: USPSTF Recommendation to

## 2021-04-14 LAB — IGP, APTIMA HPV, RFX 16/18,45
HPV Aptima: POSITIVE — AB
PAP Smear Comment: 0

## 2021-04-22 ENCOUNTER — Ambulatory Visit: Payer: 59 | Admitting: Nurse Practitioner

## 2021-07-16 ENCOUNTER — Encounter: Payer: Self-pay | Admitting: Nurse Practitioner

## 2021-07-16 ENCOUNTER — Ambulatory Visit (INDEPENDENT_AMBULATORY_CARE_PROVIDER_SITE_OTHER): Payer: 59 | Admitting: Nurse Practitioner

## 2021-07-16 VITALS — BP 130/80 | HR 82 | Temp 97.1°F | Wt 210.0 lb

## 2021-07-16 DIAGNOSIS — N3000 Acute cystitis without hematuria: Secondary | ICD-10-CM

## 2021-07-16 DIAGNOSIS — Z1231 Encounter for screening mammogram for malignant neoplasm of breast: Secondary | ICD-10-CM | POA: Diagnosis not present

## 2021-07-16 DIAGNOSIS — E039 Hypothyroidism, unspecified: Secondary | ICD-10-CM

## 2021-07-16 DIAGNOSIS — I129 Hypertensive chronic kidney disease with stage 1 through stage 4 chronic kidney disease, or unspecified chronic kidney disease: Secondary | ICD-10-CM | POA: Diagnosis not present

## 2021-07-16 DIAGNOSIS — E782 Mixed hyperlipidemia: Secondary | ICD-10-CM | POA: Diagnosis not present

## 2021-07-16 DIAGNOSIS — Z8619 Personal history of other infectious and parasitic diseases: Secondary | ICD-10-CM

## 2021-07-16 DIAGNOSIS — R300 Dysuria: Secondary | ICD-10-CM | POA: Diagnosis not present

## 2021-07-16 DIAGNOSIS — R7303 Prediabetes: Secondary | ICD-10-CM

## 2021-07-16 DIAGNOSIS — N1831 Chronic kidney disease, stage 3a: Secondary | ICD-10-CM

## 2021-07-16 DIAGNOSIS — K219 Gastro-esophageal reflux disease without esophagitis: Secondary | ICD-10-CM

## 2021-07-16 DIAGNOSIS — N952 Postmenopausal atrophic vaginitis: Secondary | ICD-10-CM

## 2021-07-16 DIAGNOSIS — G4700 Insomnia, unspecified: Secondary | ICD-10-CM

## 2021-07-16 DIAGNOSIS — Z2821 Immunization not carried out because of patient refusal: Secondary | ICD-10-CM

## 2021-07-16 DIAGNOSIS — G5793 Unspecified mononeuropathy of bilateral lower limbs: Secondary | ICD-10-CM

## 2021-07-16 LAB — POCT URINALYSIS DIP (CLINITEK)
Blood, UA: NEGATIVE
Glucose, UA: NEGATIVE mg/dL
Nitrite, UA: NEGATIVE
POC PROTEIN,UA: 30 — AB
Spec Grav, UA: >=1.03 — AB (ref 1.010–1.025)
Urobilinogen, UA: 1 E.U./dL
pH, UA: 5.5 (ref 5.0–8.0)

## 2021-07-16 MED ORDER — LOSARTAN POTASSIUM 100 MG PO TABS: 100.0000 mg | ORAL_TABLET | Freq: Every day | ORAL | 1 refills | Status: DC

## 2021-07-16 MED ORDER — GABAPENTIN 100 MG PO CAPS: 100.0000 mg | ORAL_CAPSULE | Freq: Every day | ORAL | 0 refills | Status: DC

## 2021-07-16 MED ORDER — GABAPENTIN 100 MG PO CAPS: 100.0000 mg | ORAL_CAPSULE | Freq: Every day | ORAL | 3 refills | Status: DC

## 2021-07-16 MED ORDER — AMLODIPINE BESYLATE 5 MG PO TABS: 5.0000 mg | ORAL_TABLET | Freq: Every day | ORAL | 1 refills | Status: DC

## 2021-07-16 MED ORDER — LEVOTHYROXINE SODIUM 100 MCG PO TABS: 100.0000 ug | ORAL_TABLET | Freq: Every day | ORAL | 3 refills | Status: DC

## 2021-07-16 MED ORDER — LOSARTAN POTASSIUM-HCTZ 100-25 MG PO TABS: 1.0000 | ORAL_TABLET | Freq: Every day | ORAL | 1 refills | Status: DC

## 2021-07-16 MED ORDER — PRAVASTATIN SODIUM 40 MG PO TABS: ORAL_TABLET | ORAL | 1 refills | Status: DC

## 2021-07-16 MED ORDER — OMEPRAZOLE 40 MG PO CPDR: 40.0000 mg | DELAYED_RELEASE_CAPSULE | Freq: Every day | ORAL | 3 refills | Status: DC

## 2021-07-16 MED ORDER — AMOXICILLIN 500 MG PO CAPS: 500.0000 mg | ORAL_CAPSULE | Freq: Two times a day (BID) | ORAL | 0 refills | Status: AC

## 2021-07-16 MED ORDER — FLUCONAZOLE 150 MG PO TABS: 150.0000 mg | ORAL_TABLET | Freq: Every day | ORAL | 0 refills | Status: DC

## 2021-07-16 MED ORDER — TRAZODONE HCL 150 MG PO TABS: 150.0000 mg | ORAL_TABLET | Freq: Every day | ORAL | 1 refills | Status: DC

## 2021-07-16 MED ORDER — ESTRADIOL 0.1 MG/GM VA CREA: TOPICAL_CREAM | VAGINAL | 12 refills | Status: DC

## 2021-07-16 NOTE — Progress Notes (Signed)
Subjective:  Patient ID: Sandy Beck, female    DOB: [birth year]  Age: 61 y.o. MRN: [id number]  Chief Complaint  Patient presents with   Hyperlipidemia   Hypertension   Gastroesophageal Reflux   Prediabetes   Hypothyroidism    HPI  Sandy Beck presents for f/u of chronic medical problems. She states she has experienced dysuria for a few days. Treatment has included pushing fluids, especially water. Denies fever, chills, or diaphoresis. History of renal cancer, underwent nephrectomy. Reports bilateral foot pain, burning and tingling.   Hypertension, follow-up: She was last seen for hypertension 3 months ago.  BP at that visit was 120/82. Management includes norvasc, lisinoprol-hctz.  She reports excellent compliance with treatment. She is not having side effects.  She is following a Regular diet. She is exercising. She does smoke.     Use of agents associated with hypertension: thyroid hormones.     Pertinent labs: Lab Results  Component Value Date   CHOL 181 03/12/2021   HDL 53 03/12/2021   LDLCALC 108 (H) 03/12/2021   TRIG 111 03/12/2021   CHOLHDL 3.4 03/12/2021   Lab Results  Component Value Date   NA 138 03/12/2021   K 4.0 03/12/2021   CREATININE 1.64 (H) 03/12/2021   EGFR 35 (L) 03/12/2021   GLUCOSE 126 (H) 03/12/2021      Lipid/Cholesterol, Follow-up  Last lipid panel Other pertinent labs  Lab Results  Component Value Date   CHOL 181 03/12/2021   HDL 53 03/12/2021   LDLCALC 108 (H) 03/12/2021   TRIG 111 03/12/2021   CHOLHDL 3.4 03/12/2021   Lab Results  Component Value Date   ALT 17 03/12/2021   AST 21 03/12/2021   PLT 283 03/12/2021   TSH 2.900 03/12/2021     She was last seen for this 3 months ago.  Management includes pravastatin.  She reports excellent compliance with treatment. She is not having side effects.     GERD, Follow up:  The patient was last seen for GERD 3 months ago. Current treatment consist of: Prilosec She reports  excellent compliance with treatment. She is not having side effects. . She is NOT experiencing belching   Prediabetes, Follow-up  Lab Results  Component Value Date   HGBA1C 5.7 (H) 03/12/2021   HGBA1C 5.7 (H) 09/04/2020   GLUCOSE 126 (H) 03/12/2021   GLUCOSE 122 (H) 09/04/2020    Last seen for for this3 months ago.  Management since that visit includes diet and exercise. Current symptoms include none     Pertinent Labs:    Component Value Date/Time   CHOL 181 03/12/2021 1101   TRIG 111 03/12/2021 1101   CHOLHDL 3.4 03/12/2021 1101   CREATININE 1.64 (H) 03/12/2021 1101    Wt Readings from Last 3 Encounters:  07/16/21 210 lb (95.3 kg)  04/09/21 206 lb (93.4 kg)  03/12/21 204 lb (92.5 kg)    Current Outpatient Medications on File Prior to Visit  Medication Sig Dispense Refill   amLODipine (NORVASC) 5 MG tablet Take 1 tablet (5 mg total) by mouth daily. 90 tablet 1   estradiol (ESTRACE VAGINAL) 0.1 MG/GM vaginal cream Apply small pea-size amount to vaginal area nightly for 14 days, then use twice weekly 42.5 g 12   levothyroxine (SYNTHROID) 100 MCG tablet Take 1 tablet (100 mcg total) by mouth daily before breakfast. 90 tablet 3   losartan-hydrochlorothiazide (HYZAAR) 100-25 MG tablet Take 1 tablet by mouth daily.     omeprazole (PRILOSEC) 40 MG capsule  Take 1 capsule (40 mg total) by mouth daily. 90 capsule 3   pravastatin (PRAVACHOL) 40 MG tablet pravastatin 40 mg tablet  Take 0.5 tablets every day by oral route for 90 days. 90 tablet 1   traZODone (DESYREL) 150 MG tablet Take 150 mg by mouth at bedtime.     No current facility-administered medications on file prior to visit.   Past Medical History:  Diagnosis Date   Allergy    Arthritis    Cancer ([place]) 2022   left renal cancer, had left nephrectomy   GERD (gastroesophageal reflux disease)    Heart murmur    Hyperlipidemia    Hypertension    Sleep apnea    not currently using cpap   Thyroid disease    Past  Surgical History:  Procedure Laterality Date   LAPAROTOMY  1980s   tubal pregnancy, left fallopian tube removal   NEPHRECTOMY Left     Family History  Problem Relation Age of Onset   Alzheimer's disease Mother    Diabetes Mellitus II Mother    Hypertension Father    Stroke Sister    Hypertension Sister    Social History   Socioeconomic History   Marital status: Married    Spouse name: Sandy Beck   Number of children: 1   Years of education: Not on file   Highest education level: Not on file  Occupational History   Occupation: Scientist, water quality   Occupation: Retired  Tobacco Use   Smoking status: Every Day    Packs/day: 0.50    Years: 45.00    Total pack years: 22.50    Types: Cigarettes   Smokeless tobacco: Never  Vaping Use   Vaping Use: Never used  Substance and Sexual Activity   Alcohol use: Not Currently   Drug use: Never   Sexual activity: Not on file  Other Topics Concern   Not on file  Social History Narrative   Not on file   Social Determinants of Health   Financial Resource Strain: Low Risk  (07/16/2021)   Overall Financial Resource Strain (CARDIA)    Difficulty of Paying Living Expenses: Not hard at all  Food Insecurity: No Food Insecurity (07/16/2021)   Hunger Vital Sign    Worried About Running Out of Food in the Last Year: Never true    Ran Out of Food in the Last Year: Never true  Transportation Needs: No Transportation Needs (07/16/2021)   PRAPARE - Hydrologist (Medical): No    Lack of Transportation (Non-Medical): No  Physical Activity: Sufficiently Active (07/16/2021)   Exercise Vital Sign    Days of Exercise per Week: 5 days    Minutes of Exercise per Session: 30 min  Stress: No Stress Concern Present (07/16/2021)   [place]    Feeling of Stress : Not at all  Social Connections: Moderately Isolated (07/16/2021)   Social Connection and Isolation Panel  [NHANES]    Frequency of Communication with Friends and Family: More than three times a week    Frequency of Social Gatherings with Friends and Family: More than three times a week    Attends Religious Services: Never    Marine scientist or Organizations: No    Attends Archivist Meetings: Never    Marital Status: Married    Review of Systems  Constitutional:  Negative for chills, fatigue and fever.  HENT:  Negative for congestion, ear pain,  rhinorrhea and sore throat.   Respiratory:  Negative for cough and shortness of breath.   Cardiovascular:  Negative for chest pain.  Gastrointestinal:  Negative for abdominal pain, constipation, diarrhea, nausea and vomiting.  Genitourinary:  Negative for dysuria and urgency.  Musculoskeletal:  Positive for back pain (lower). Negative for myalgias.  Neurological:  Negative for dizziness, weakness, light-headedness and headaches.  Psychiatric/Behavioral:  Negative for dysphoric mood. The patient is not nervous/anxious.      Objective:  BP 130/80   Pulse 82   Temp (!) 97.1 F (36.2 C)   Wt 210 lb (95.3 kg)   SpO2 98%   BMI 34.95 kg/m       07/16/2021    9:50 AM 04/09/2021   10:36 AM 03/12/2021   10:15 AM  BP/Weight  Systolic BP  944 967  Diastolic BP  90 82  Wt. (Lbs) 210 206 204  BMI 34.95 kg/m2 34.28 kg/m2 33.95 kg/m2    Physical Exam Vitals reviewed.  Constitutional:      Appearance: She is obese.  HENT:     Right Ear: Tympanic membrane normal.     Left Ear: Tympanic membrane normal.     Nose: Nose normal.     Mouth/Throat:     Mouth: Mucous membranes are moist.  Cardiovascular:     Rate and Rhythm: Normal rate and regular rhythm.     Pulses: Normal pulses.     Heart sounds: Normal heart sounds.  Pulmonary:     Effort: Pulmonary effort is normal.     Breath sounds: Normal breath sounds.  Abdominal:     General: Bowel sounds are normal.     Palpations: Abdomen is soft.  Skin:    General: Skin is warm  and dry.     Capillary Refill: Capillary refill takes less than 2 seconds.  Neurological:     General: No focal deficit present.     Mental Status: She is alert and oriented to person, place, and time.  Psychiatric:        Mood and Affect: Mood normal.        Behavior: Behavior normal.         Lab Results  Component Value Date   WBC 6.6 03/12/2021   HGB 12.8 03/12/2021   HCT 39.0 03/12/2021   PLT 283 03/12/2021   GLUCOSE 126 (H) 03/12/2021   CHOL 181 03/12/2021   TRIG 111 03/12/2021   HDL 53 03/12/2021   LDLCALC 108 (H) 03/12/2021   ALT 17 03/12/2021   AST 21 03/12/2021   NA 138 03/12/2021   K 4.0 03/12/2021   CL 99 03/12/2021   CREATININE 1.64 (H) 03/12/2021   BUN 25 03/12/2021   CO2 22 03/12/2021   TSH 2.900 03/12/2021   HGBA1C 5.7 (H) 03/12/2021      Assessment & Plan:  1. Benign hypertension with stage 3a chronic kidney disease (HCC)-well controlled - CBC with Differential/Platelet - Comprehensive metabolic panel - TSH - amLODipine (NORVASC) 5 MG tablet; Take 1 tablet (5 mg total) by mouth daily.  Dispense: 90 tablet; Refill: 1 - losartan (COZAAR) 100 MG tablet; Take 1 tablet (100 mg total) by mouth daily.  Dispense: 90 tablet; Refill: 1  2. Screening mammogram for breast cancer - MM DIGITAL SCREENING BILATERAL; Future  3. Mixed hyperlipidemia-not at goal - Lipid panel - pravastatin (PRAVACHOL) 40 MG tablet; pravastatin 40 mg tablet  Take 0.5 tablets every day by oral route for 90 days.  Dispense: 90 tablet;  Refill: 1  4. Acquired hypothyroidism-well controlled - levothyroxine (SYNTHROID) 100 MCG tablet; Take 1 tablet (100 mcg total) by mouth daily before breakfast.  Dispense: 90 tablet; Refill: 3  5. Prediabetes-well controlled - Hemoglobin A1c -low carb heart healthy diet  6. Gastroesophageal reflux disease, unspecified whether esophagitis present-well controlled - omeprazole (PRILOSEC) 40 MG capsule; Take 1 capsule (40 mg total) by mouth daily.   Dispense: 90 capsule; Refill: 3 -avoid foods that trigger GERD  7. Vaccination declined -pt declined Shingles, TDAP, and COVID-19 vaccines  8. Vaginal atrophy - estradiol (ESTRACE VAGINAL) 0.1 MG/GM vaginal cream; Apply small pea-size amount to vaginal area nightly for 14 days, then use twice weekly  Dispense: 42.5 g; Refill: 12  9. Dysuria - Urine Culture - POCT URINALYSIS DIP (CLINITEK)  10. Insomnia, unspecified type - traZODone (DESYREL) 150 MG tablet; Take 1 tablet (150 mg total) by mouth at bedtime.  Dispense: 90 tablet; Refill: 1  11. Neuropathy of both feet - gabapentin (NEURONTIN) 100 MG capsule; Take 1 capsule (100 mg total) by mouth at bedtime.  Dispense: 14 capsule; Refill: 0 - gabapentin (NEURONTIN) 100 MG capsule; Take 1 capsule (100 mg total) by mouth at bedtime.  Dispense: 90 capsule; Refill: 3  12. History of candidiasis of vagina - fluconazole (DIFLUCAN) 150 MG tablet; Take 1 tablet (150 mg total) by mouth daily.  Dispense: 2 tablet; Refill: 0  13. Acute cystitis without hematuria - amoxicillin (AMOXIL) 500 MG capsule; Take 1 capsule (500 mg total) by mouth 2 (two) times daily for 7 days.  Dispense: 14 capsule; Refill: 0   Begin Gabapentin 100 mg at bedtime Push fluids, especially water Take Amoxicillin twice daily for 7 days Continue medications We will call you with lab results and mammogram appt Follow-up every 11-month, fasting      Follow-up: 334-month fasting  An After Visit Summary was printed and given to the patient.  I, ShRip HarbourNP, have reviewed all documentation for this visit. The documentation on 07/16/21 for the exam, diagnosis, procedures, and orders are all accurate and complete.   Signed, ShRip HarbourNP [phone]

## 2021-07-16 NOTE — Patient Instructions (Addendum)
Begin Gabapentin 100 mg at bedtime Push fluids, especially water Take Amoxicillin twice daily for 7 days Continue medications We will call you with lab results and mammogram appt Follow-up every 28-month, fasting   Peripheral Neuropathy Peripheral neuropathy is a type of nerve damage. It affects nerves that carry signals between the spinal cord and the arms, legs, and the rest of the body (peripheral nerves). It does not affect nerves in the spinal cord or brain. In peripheral neuropathy, one nerve or a group of nerves may be damaged. Peripheral neuropathy is a broad category that includes many specific nerve disorders, like diabetic neuropathy, hereditary neuropathy, and carpal tunnel syndrome. What are the causes? This condition may be caused by: Certain diseases, such as: Diabetes. This is the most common cause of peripheral neuropathy. Autoimmune diseases, such as rheumatoid arthritis and systemic lupus erythematosus. Nerve diseases that are passed from parent to child (inherited). Kidney disease. Thyroid disease. Other causes may include: Nerve injury. Pressure or stress on a nerve that lasts a long time. Lack (deficiency) of B vitamins. This can result from alcoholism, poor diet, or a restricted diet. Infections. Some medicines, such as cancer medicines (chemotherapy). Poisonous (toxic) substances, such as lead and mercury. Too little blood flowing to the legs. In some cases, the cause of this condition is not known. What are the signs or symptoms? Symptoms of this condition depend on which of your nerves is damaged. Symptoms in the legs, hands, and arms can include: Loss of feeling (numbness) in the feet, hands, or both. Tingling in the feet, hands, or both. Burning pain. Very sensitive skin. Weakness. Not being able to move a part of the body (paralysis). Clumsiness or poor coordination. Muscle twitching. Loss of balance. Symptoms in other parts of the body can  include: Not being able to control your bladder. Feeling dizzy. Sexual problems. How is this diagnosed? Diagnosing and finding the cause of peripheral neuropathy can be difficult. Your health care provider will take your medical history and do a physical exam. A neurological exam will also be done. This involves checking things that are affected by your brain, spinal cord, and nerves (nervous system). For example, your health care provider will check your reflexes, how you move, and what you can feel. You may have other tests, such as: Blood tests. Electromyogram (EMG) and nerve conduction tests. These tests check nerve function and how well the nerves are controlling the muscles. Imaging tests, such as a CT scan or MRI, to rule out other causes of your symptoms. Removing a small piece of nerve to be examined in a lab (nerve biopsy). Removing and examining a small amount of the fluid that surrounds the brain and spinal cord (lumbar puncture). How is this treated? Treatment for this condition may involve: Treating the underlying cause of the neuropathy, such as diabetes, kidney disease, or vitamin deficiencies. Stopping medicines that can cause neuropathy, such as chemotherapy. Medicine to help relieve pain. Medicines may include: Prescription or over-the-counter pain medicine. Anti-seizure medicine. Antidepressants. Pain-relieving patches that are applied to painful areas of skin. Surgery to relieve pressure on a nerve or to destroy a nerve that is causing pain. Physical therapy to help improve movement and balance. Devices to help you move around (assistive devices). Follow these instructions at home: Medicines Take over-the-counter and prescription medicines only as told by your health care provider. Do not take any other medicines without first asking your health care provider. Ask your health care provider if the medicine prescribed to  you requires you to avoid driving or using  machinery. Lifestyle  Do not use any products that contain nicotine or tobacco. These products include cigarettes, chewing tobacco, and vaping devices, such as e-cigarettes. Smoking keeps blood from reaching damaged nerves. If you need help quitting, ask your health care provider. Avoid or limit alcohol. Too much alcohol can cause a vitamin B deficiency, and vitamin B is needed for healthy nerves. Eat a healthy diet. This includes: Eating foods that are high in fiber, such as beans, whole grains, and fresh fruits and vegetables. Limiting foods that are high in fat and processed sugars, such as fried or sweet foods. General instructions  If you have diabetes, work closely with your health care provider to keep your blood sugar under control. If you have numbness in your feet: Check every day for signs of injury or infection. Watch for redness, warmth, and swelling. Wear padded socks and comfortable shoes. These help protect your feet. Develop a good support system. Living with peripheral neuropathy can be stressful. Consider talking with a mental health specialist or joining a support group. Use assistive devices and attend physical therapy as told by your health care provider. This may include using a walker or a cane. Keep all follow-up visits. This is important. Where to find more information [employer] of Neurological Disorders: [employer] Contact a health care provider if: You have new signs or symptoms of peripheral neuropathy. You are struggling emotionally from dealing with peripheral neuropathy. Your pain is not well controlled. Get help right away if: You have an injury or infection that is not healing normally. You develop new weakness in an arm or leg. You have fallen or do so frequently. Summary Peripheral neuropathy is when the nerves in the arms or legs are damaged, resulting in numbness, weakness, or pain. There are many causes of peripheral neuropathy,  including diabetes, pinched nerves, vitamin deficiencies, autoimmune disease, and hereditary conditions. Diagnosing and finding the cause of peripheral neuropathy can be difficult. Your health care provider will take your medical history, do a physical exam, and do tests, including blood tests and nerve function tests. Treatment involves treating the underlying cause of the neuropathy and taking medicines to help control pain. Physical therapy and assistive devices may also help. This information is not intended to replace advice given to you by your health care provider. Make sure you discuss any questions you have with your health care provider. Document Revised: 09/09/2020 Document Reviewed: 09/09/2020 Elsevier Patient Education  [place].

## 2021-07-17 ENCOUNTER — Ambulatory Visit: Payer: 59 | Admitting: Nurse Practitioner

## 2021-07-17 LAB — COMPREHENSIVE METABOLIC PANEL WITH GFR
ALT: 15 [IU]/L (ref 0–32)
AST: 21 [IU]/L (ref 0–40)
Albumin/Globulin Ratio: 1.8 (ref 1.2–2.2)
Albumin: 4.3 g/dL (ref 3.8–4.8)
Alkaline Phosphatase: 110 [IU]/L (ref 44–121)
BUN/Creatinine Ratio: 12 (ref 12–28)
BUN: 17 mg/dL (ref 8–27)
Bilirubin Total: 0.3 mg/dL (ref 0.0–1.2)
CO2: 21 mmol/L (ref 20–29)
Calcium: 9.5 mg/dL (ref 8.7–10.3)
Chloride: 101 mmol/L (ref 96–106)
Creatinine, Ser: 1.43 mg/dL — ABNORMAL HIGH (ref 0.57–1.00)
Globulin, Total: 2.4 g/dL (ref 1.5–4.5)
Glucose: 137 mg/dL — ABNORMAL HIGH (ref 70–99)
Potassium: 4.2 mmol/L (ref 3.5–5.2)
Sodium: 139 mmol/L (ref 134–144)
Total Protein: 6.7 g/dL (ref 6.0–8.5)
eGFR: 41 mL/min/{1.73_m2} — ABNORMAL LOW

## 2021-07-17 LAB — CBC WITH DIFFERENTIAL/PLATELET
Basophils Absolute: 0.1 10*3/uL (ref 0.0–0.2)
Basos: 1 %
EOS (ABSOLUTE): 0.1 10*3/uL (ref 0.0–0.4)
Eos: 2 %
Hematocrit: 39.7 % (ref 34.0–46.6)
Hemoglobin: 13.5 g/dL (ref 11.1–15.9)
Immature Grans (Abs): 0 10*3/uL (ref 0.0–0.1)
Immature Granulocytes: 0 %
Lymphocytes Absolute: 2.1 10*3/uL (ref 0.7–3.1)
Lymphs: 41 %
MCH: 31.3 pg (ref 26.6–33.0)
MCHC: 34 g/dL (ref 31.5–35.7)
MCV: 92 fL (ref 79–97)
Monocytes Absolute: 0.5 10*3/uL (ref 0.1–0.9)
Monocytes: 9 %
Neutrophils Absolute: 2.4 10*3/uL (ref 1.4–7.0)
Neutrophils: 47 %
Platelets: 276 10*3/uL (ref 150–450)
RBC: 4.31 x10E6/uL (ref 3.77–5.28)
RDW: 12.2 % (ref 11.7–15.4)
WBC: 5.2 10*3/uL (ref 3.4–10.8)

## 2021-07-17 LAB — LIPID PANEL
Chol/HDL Ratio: 4.8 ratio — ABNORMAL HIGH (ref 0.0–4.4)
Cholesterol, Total: 205 mg/dL — ABNORMAL HIGH (ref 100–199)
HDL: 43 mg/dL
LDL Chol Calc (NIH): 113 mg/dL — ABNORMAL HIGH (ref 0–99)
Triglycerides: 282 mg/dL — ABNORMAL HIGH (ref 0–149)
VLDL Cholesterol Cal: 49 mg/dL — ABNORMAL HIGH (ref 5–40)

## 2021-07-17 LAB — URINE CULTURE

## 2021-07-17 LAB — HEMOGLOBIN A1C
Est. average glucose Bld gHb Est-mCnc: 120 mg/dL
Hgb A1c MFr Bld: 5.8 % — ABNORMAL HIGH (ref 4.8–5.6)

## 2021-07-17 LAB — CARDIOVASCULAR RISK ASSESSMENT

## 2021-07-17 LAB — TSH: TSH: 4.23 u[IU]/mL (ref 0.450–4.500)

## 2021-07-27 ENCOUNTER — Other Ambulatory Visit: Payer: Self-pay | Admitting: Nurse Practitioner

## 2021-07-27 DIAGNOSIS — N1831 Chronic kidney disease, stage 3a: Secondary | ICD-10-CM

## 2021-07-27 DIAGNOSIS — Z905 Acquired absence of kidney: Secondary | ICD-10-CM

## 2021-07-27 DIAGNOSIS — R7303 Prediabetes: Secondary | ICD-10-CM

## 2021-07-27 DIAGNOSIS — Z85528 Personal history of other malignant neoplasm of kidney: Secondary | ICD-10-CM

## 2021-07-27 MED ORDER — OZEMPIC (0.25 OR 0.5 MG/DOSE) 2 MG/3ML ~~LOC~~ SOPN: 0.2500 mg | PEN_INJECTOR | SUBCUTANEOUS | 0 refills | Status: DC

## 2021-07-28 ENCOUNTER — Telehealth: Payer: Self-pay

## 2021-07-28 NOTE — Telephone Encounter (Signed)
PA submitted and approved via covermymeds for ozempic.

## 2021-08-17 ENCOUNTER — Other Ambulatory Visit: Payer: Self-pay

## 2021-08-17 DIAGNOSIS — E782 Mixed hyperlipidemia: Secondary | ICD-10-CM

## 2021-08-17 MED ORDER — PRAVASTATIN SODIUM 80 MG PO TABS: ORAL_TABLET | ORAL | 0 refills | Status: DC

## 2021-08-20 ENCOUNTER — Other Ambulatory Visit: Payer: Self-pay | Admitting: Nurse Practitioner

## 2021-08-20 DIAGNOSIS — E782 Mixed hyperlipidemia: Secondary | ICD-10-CM

## 2021-08-20 MED ORDER — PRAVASTATIN SODIUM 80 MG PO TABS: ORAL_TABLET | ORAL | 1 refills | Status: DC

## 2021-09-24 ENCOUNTER — Other Ambulatory Visit: Payer: Self-pay | Admitting: Family Medicine

## 2021-09-24 ENCOUNTER — Encounter: Payer: Self-pay | Admitting: Nurse Practitioner

## 2021-09-24 ENCOUNTER — Other Ambulatory Visit: Payer: Self-pay

## 2021-09-24 MED ORDER — CEPHALEXIN 500 MG PO CAPS: 500.0000 mg | ORAL_CAPSULE | Freq: Two times a day (BID) | ORAL | 0 refills | Status: DC

## 2021-10-22 ENCOUNTER — Encounter: Payer: Self-pay | Admitting: Nurse Practitioner

## 2021-10-22 ENCOUNTER — Ambulatory Visit (INDEPENDENT_AMBULATORY_CARE_PROVIDER_SITE_OTHER): Payer: 59 | Admitting: Nurse Practitioner

## 2021-10-22 VITALS — BP 124/74 | HR 70 | Temp 97.2°F | Ht 65.0 in | Wt 210.0 lb

## 2021-10-22 DIAGNOSIS — N1831 Chronic kidney disease, stage 3a: Secondary | ICD-10-CM | POA: Diagnosis not present

## 2021-10-22 DIAGNOSIS — F17218 Nicotine dependence, cigarettes, with other nicotine-induced disorders: Secondary | ICD-10-CM

## 2021-10-22 DIAGNOSIS — E039 Hypothyroidism, unspecified: Secondary | ICD-10-CM

## 2021-10-22 DIAGNOSIS — E782 Mixed hyperlipidemia: Secondary | ICD-10-CM | POA: Diagnosis not present

## 2021-10-22 DIAGNOSIS — E6609 Other obesity due to excess calories: Secondary | ICD-10-CM

## 2021-10-22 DIAGNOSIS — R7303 Prediabetes: Secondary | ICD-10-CM | POA: Diagnosis not present

## 2021-10-22 DIAGNOSIS — K219 Gastro-esophageal reflux disease without esophagitis: Secondary | ICD-10-CM | POA: Diagnosis not present

## 2021-10-22 DIAGNOSIS — I129 Hypertensive chronic kidney disease with stage 1 through stage 4 chronic kidney disease, or unspecified chronic kidney disease: Secondary | ICD-10-CM

## 2021-10-22 DIAGNOSIS — Z85528 Personal history of other malignant neoplasm of kidney: Secondary | ICD-10-CM

## 2021-10-22 DIAGNOSIS — Z1231 Encounter for screening mammogram for malignant neoplasm of breast: Secondary | ICD-10-CM

## 2021-10-22 DIAGNOSIS — Z8619 Personal history of other infectious and parasitic diseases: Secondary | ICD-10-CM

## 2021-10-22 DIAGNOSIS — Z6834 Body mass index (BMI) 34.0-34.9, adult: Secondary | ICD-10-CM

## 2021-10-22 MED ORDER — FLUCONAZOLE 150 MG PO TABS: 150.0000 mg | ORAL_TABLET | Freq: Every day | ORAL | 0 refills | Status: DC

## 2021-10-22 NOTE — Patient Instructions (Addendum)
Continue medications  We will call you with lab results, mammogram, and kidney specialist appointment Continue to decrease smoking cigarettes Follow-up in 75-month   Food Basics for Chronic Kidney Disease Chronic kidney disease (CKD) is when your kidneys are not working well. They cannot remove waste, fluids, and other substances from your blood the way they should. These substances can build up, which can worsen kidney damage and affect how your body works. Eating certain foods can lead to a buildup of these substances. Changing your diet can help prevent more kidney damage. Diet changes may also delay dialysis or even keep you from needing it. What nutrients should I limit? Work with your treatment team and a food expert (dietitian) to make a meal plan that's right for you. Foods you can eat and foods you should limit or avoid will depend on the stage of your kidney disease and any other health conditions you have. The items listed below are not a complete list. Talk with your dietitian to learn what is best for you. Potassium Potassium affects how steadily your heart beats. Too much potassium in your blood can cause an irregular heartbeat or even a heart attack. You may need to limit foods that are high in potassium, such as: Liquid milk and soy milk. Salt substitutes that contain potassium. Fruits like bananas, apricots, nectarines, melon, prunes, raisins, kiwi, and oranges. Vegetables, such as potatoes, sweet potatoes, yams, tomatoes, leafy greens, beets, avocado, pumpkin, and winter squash. Beans, like lima beans. Nuts. Phosphorus Phosphorus is a mineral found in your bones. You need a balance between calcium and phosphorus to build and maintain healthy bones. Too much added phosphorus from the foods you eat can pull calcium from your bones. Losing calcium can make your bones weak and more likely to break. Too much phosphorus can also make your skin itch. You may need to limit foods that  are high in phosphorus or that have added phosphorus, such as: Liquid milk and dairy products. Dark-colored sodas or soft drinks. Bran cereals and oatmeal. Protein  Protein helps you make and keep muscle. Protein also helps to repair your body's cells and tissues. One of the natural breakdown products of protein is a waste product called urea. When your kidneys are not working well, they cannot remove types of waste like urea. Reducing protein in your diet can help keep urea from building up in your blood. Depending on your stage of kidney disease, you may need to eat smaller portions of foods that are high in protein. Sources of animal protein include: Meat (all types). Fish and seafood. Poultry. Eggs. Dairy. Other protein foods include: Beans and legumes. Nuts and nut butter. Soy, like tofu.  Sodium Salt (sodium) helps to keep a healthy balance of fluids in your body. Too much salt can increase your blood pressure, which can harm your heart and lungs. Extra salt can also cause your body to keep too much fluid, making your kidneys work harder. You may need to limit or avoid foods that are high in salt, such as: Salt seasonings. Soy and teriyaki sauce. Packaged, precooked, cured, or processed meats, such as sausages or meat loaves. Sardines. Salted crackers and snack foods. Fast food. Canned soups and most canned foods. Pickled foods. Vegetable juice. Boxed mixes or ready-to-eat boxed meals and side dishes. Bottled dressings, sauces, and marinades. Talk with your dietitian about how much potassium, phosphorus, protein, and salt you may have each day. Helpful tips Read food labels  Check the amount of  salt in foods. Limit foods that have salt or sodium listed among the first five ingredients. Try to eat low-salt foods. Check the ingredient list for added phosphorus or potassium. "Phos" in an ingredient is a sign that phosphorus has been added. Do not buy foods that are  calcium-enriched or that have calcium added to them (are fortified). Buy canned vegetables and beans that say "no salt added" and rinse them before eating. Lifestyle Limit the amount of protein you eat from animal sources each day. Focus on protein from plant sources, like tofu and dried beans, peas, and lentils. Do not add salt to food when cooking or before eating. Do not eat star fruit. It can be toxic for people with kidney problems. Talk with your health care provider before taking any vitamin or mineral supplements. If told by your health care provider, track how much liquid you drink so you can avoid drinking too much. You may need to include foods you eat that are made mostly from water, like gelatin, ice cream, soups, and juicy fruits and vegetables. If you have diabetes: If you have diabetes (diabetes mellitus) and CKD, you need to keep your blood sugar (glucose) in the target range recommended by your health care provider. Follow your diabetes management plan. This may include: Checking your blood glucose regularly. Taking medicines by mouth, or taking insulin, or both. Exercising for at least 30 minutes on 5 or more days each week, or as told by your health care provider. Tracking how many servings of carbohydrates you eat at each meal. Not using orange juice to treat low blood sugars. Instead, use apple juice, cranberry juice, or clear soda. You may be given guidelines on what foods and nutrients you may eat, and how much you can have each day. This depends on your stage of kidney disease and whether you have high blood pressure (hypertension). Follow the meal plan your dietitian gives you. To learn more: [employer] of Diabetes and Digestive and Kidney Diseases: [employer]: kidney.org Summary Chronic kidney disease (CKD) is when your kidneys are not working well. They cannot remove waste, fluids, and other substances from your blood the way they  should. These substances can build up, which can worsen kidney damage and affect how your body works. Changing your diet can help prevent more kidney damage. Diet changes may also delay dialysis or even keep you from needing it. Diet changes are different for each person with CKD. Work with a dietitian to set up a meal plan that is right for you. This information is not intended to replace advice given to you by your health care provider. Make sure you discuss any questions you have with your health care provider. Document Revised: 04/24/2021 Document Reviewed: 04/30/2019 Elsevier Patient Education  [place].

## 2021-10-22 NOTE — Progress Notes (Signed)
Subjective:  Patient ID: Sandy Beck, female    DOB: [birth year]  Age: 61 y.o. MRN: [id number]  CC: Type 2 DM HTN   HPI  Pt presents for follow-up of HTN, hyperlipidemia, and prediabetes. She recently retired due to chronic foot pain. She has a history of renal cell carninoma, underwent nephrectomy. She was recently referred to nephrology for stage 3a CKD. She states she did not have a good experience and wishes to be referred to nephrologist in [place] area. Pt was encouraged to quit smoking. States she has decreased amount of cigarettes daily.    Hypertension, follow-up: She was last seen for hypertension 3 months ago.  BP at that visit was 130/80. Management includes lisinopril-hctz. She reports excellent compliance with treatment. She is not having side effects.  She is following a Regular diet. She is not exercising. She does smoke.     Use of agents associated with hypertension: thyroid hormones.   Pertinent labs: Lab Results  Component Value Date   CHOL 205 (H) 07/16/2021   HDL 43 07/16/2021   LDLCALC 113 (H) 07/16/2021   TRIG 282 (H) 07/16/2021   CHOLHDL 4.8 (H) 07/16/2021   Lab Results  Component Value Date   NA 139 07/16/2021   K 4.2 07/16/2021   CREATININE 1.43 (H) 07/16/2021   EGFR 41 (L) 07/16/2021   GLUCOSE 137 (H) 07/16/2021     Lipid/Cholesterol, Follow-up  Last lipid panel Other pertinent labs  Lab Results  Component Value Date   CHOL 205 (H) 07/16/2021   HDL 43 07/16/2021   LDLCALC 113 (H) 07/16/2021   TRIG 282 (H) 07/16/2021   CHOLHDL 4.8 (H) 07/16/2021   Lab Results  Component Value Date   ALT 15 07/16/2021   AST 21 07/16/2021   PLT 276 07/16/2021   TSH 4.230 07/16/2021     Management includes pravastatin. She reports excellent compliance with treatment. She is not having side effects.  Current diet: not asked Current exercise: none  GERD, Follow up:  Current treatment consist of: Prilosec 40 mg QD She reports excellent  compliance with treatment. She is not having side effects. . She is NOT experiencing bilious reflux, dysphagia, or heartburn  Prediabetes, Follow-up  Lab Results  Component Value Date   HGBA1C 5.8 (H) 07/16/2021   HGBA1C 5.7 (H) 03/12/2021   HGBA1C 5.7 (H) 09/04/2020   GLUCOSE 137 (H) 07/16/2021   GLUCOSE 126 (H) 03/12/2021   GLUCOSE 122 (H) 09/04/2020    Management since that visit includes diet and exercise. Current symptoms include none and have been stable. Prior visit with dietician: no Pertinent Labs:    Component Value Date/Time   CHOL 205 (H) 07/16/2021 1042   TRIG 282 (H) 07/16/2021 1042   CHOLHDL 4.8 (H) 07/16/2021 1042   CREATININE 1.43 (H) 07/16/2021 1042    Wt Readings from Last 3 Encounters:  10/22/21 210 lb (95.3 kg)  07/16/21 210 lb (95.3 kg)  04/09/21 206 lb (93.4 kg)   Current Outpatient Medications on File Prior to Visit  Medication Sig Dispense Refill   amLODipine (NORVASC) 5 MG tablet Take 1 tablet (5 mg total) by mouth daily. 90 tablet 1   cephALEXin (KEFLEX) 500 MG capsule Take 1 capsule (500 mg total) by mouth 2 (two) times daily. 28 capsule 0   estradiol (ESTRACE VAGINAL) 0.1 MG/GM vaginal cream Apply small pea-size amount to vaginal area nightly for 14 days, then use twice weekly 42.5 g 12   fluconazole (DIFLUCAN) 150 MG tablet Take  1 tablet (150 mg total) by mouth daily. 2 tablet 0   gabapentin (NEURONTIN) 100 MG capsule Take 1 capsule (100 mg total) by mouth at bedtime. 90 capsule 3   levothyroxine (SYNTHROID) 100 MCG tablet Take 1 tablet (100 mcg total) by mouth daily before breakfast. 90 tablet 3   losartan (COZAAR) 100 MG tablet Take 1 tablet (100 mg total) by mouth daily. 90 tablet 1   omeprazole (PRILOSEC) 40 MG capsule Take 1 capsule (40 mg total) by mouth daily. 90 capsule 3   pravastatin (PRAVACHOL) 80 MG tablet one tablet by mouth daily. 90 tablet 1   traZODone (DESYREL) 150 MG tablet Take 1 tablet (150 mg total) by mouth at bedtime. 90  tablet 1   No current facility-administered medications on file prior to visit.   Past Medical History:  Diagnosis Date   Allergy    Arthritis    Cancer ([place]) 2022   left renal cancer, had left nephrectomy   GERD (gastroesophageal reflux disease)    Heart murmur    Hyperlipidemia    Hypertension    Sleep apnea    not currently using cpap   Thyroid disease    Past Surgical History:  Procedure Laterality Date   LAPAROTOMY  1980s   tubal pregnancy, left fallopian tube removal   NEPHRECTOMY Left     Family History  Problem Relation Age of Onset   Alzheimer's disease Mother    Diabetes Mellitus II Mother    Hypertension Father    Stroke Sister    Hypertension Sister    Social History   Socioeconomic History   Marital status: Married    Spouse name: Tanganyika Bowlds   Number of children: 1   Years of education: Not on file   Highest education level: Not on file  Occupational History   Occupation: Scientist, water quality   Occupation: Retired  Tobacco Use   Smoking status: Every Day    Packs/day: 0.50    Years: 45.00    Total pack years: 22.50    Types: Cigarettes   Smokeless tobacco: Never  Vaping Use   Vaping Use: Never used  Substance and Sexual Activity   Alcohol use: Not Currently   Drug use: Never   Sexual activity: Not on file  Other Topics Concern   Not on file  Social History Narrative   Not on file   Social Determinants of Health   Financial Resource Strain: Low Risk  (07/16/2021)   Overall Financial Resource Strain (CARDIA)    Difficulty of Paying Living Expenses: Not hard at all  Food Insecurity: No Food Insecurity (07/16/2021)   Hunger Vital Sign    Worried About Running Out of Food in the Last Year: Never true    Ran Out of Food in the Last Year: Never true  Transportation Needs: No Transportation Needs (07/16/2021)   PRAPARE - Hydrologist (Medical): No    Lack of Transportation (Non-Medical): No  Physical Activity: Sufficiently  Active (07/16/2021)   Exercise Vital Sign    Days of Exercise per Week: 5 days    Minutes of Exercise per Session: 30 min  Stress: No Stress Concern Present (07/16/2021)   [place]    Feeling of Stress : Not at all  Social Connections: Moderately Isolated (07/16/2021)   Social Connection and Isolation Panel [NHANES]    Frequency of Communication with Friends and Family: More than three times a week  Frequency of Social Gatherings with Friends and Family: More than three times a week    Attends Religious Services: Never    Marine scientist or Organizations: No    Attends Archivist Meetings: Never    Marital Status: Married    Review of Systems  Constitutional:  Negative for chills, fatigue and fever.  HENT:  Negative for congestion, ear pain, rhinorrhea and sore throat.   Respiratory:  Negative for cough and shortness of breath.   Cardiovascular:  Negative for chest pain.  Gastrointestinal:  Negative for abdominal pain, constipation, diarrhea, nausea and vomiting.  Genitourinary:  Negative for dysuria and urgency.  Musculoskeletal:  Negative for back pain and myalgias.  Neurological:  Negative for dizziness, weakness, light-headedness and headaches.  Psychiatric/Behavioral:  Negative for dysphoric mood. The patient is not nervous/anxious.      Objective:  BP 124/74   Pulse 70   Temp (!) 97.2 F (36.2 C)   Ht $R'5\' 5"'fx$  (1.651 m)   Wt 210 lb (95.3 kg)   SpO2 98%   BMI 34.95 kg/m       07/16/2021    9:50 AM 04/09/2021   10:36 AM 03/12/2021   10:15 AM  BP/Weight  Systolic BP 948 546 270  Diastolic BP 80 90 82  Wt. (Lbs) 210 206 204  BMI 34.95 kg/m2 34.28 kg/m2 33.95 kg/m2    Physical Exam Vitals reviewed.  Constitutional:      Appearance: She is obese.  HENT:     Head: Normocephalic.     Right Ear: Tympanic membrane normal.     Left Ear: Tympanic membrane normal.     Nose: Nose normal.      Mouth/Throat:     Mouth: Mucous membranes are moist.  Eyes:     Pupils: Pupils are equal, round, and reactive to light.  Cardiovascular:     Rate and Rhythm: Normal rate and regular rhythm.  Pulmonary:     Effort: Pulmonary effort is normal.     Breath sounds: Normal breath sounds.  Abdominal:     General: Bowel sounds are normal.     Palpations: Abdomen is soft.  Musculoskeletal:        General: Normal range of motion.  Skin:    General: Skin is warm and dry.     Capillary Refill: Capillary refill takes less than 2 seconds.  Neurological:     General: No focal deficit present.     Mental Status: She is alert and oriented to person, place, and time.  Psychiatric:        Mood and Affect: Mood normal.        Behavior: Behavior normal.    Lab Results  Component Value Date   WBC 5.2 07/16/2021   HGB 13.5 07/16/2021   HCT 39.7 07/16/2021   PLT 276 07/16/2021   GLUCOSE 137 (H) 07/16/2021   CHOL 205 (H) 07/16/2021   TRIG 282 (H) 07/16/2021   HDL 43 07/16/2021   LDLCALC 113 (H) 07/16/2021   ALT 15 07/16/2021   AST 21 07/16/2021   NA 139 07/16/2021   K 4.2 07/16/2021   CL 101 07/16/2021   CREATININE 1.43 (H) 07/16/2021   BUN 17 07/16/2021   CO2 21 07/16/2021   TSH 4.230 07/16/2021   HGBA1C 5.8 (H) 07/16/2021      Assessment & Plan:    1. Prediabetes-well controlled  - CBC with Differential/Platelet - Comprehensive metabolic panel - Hemoglobin A1c -heart healthy diet -increase  physical activity  2. Gastroesophageal reflux disease, unspecified whether esophagitis present-well controlled  - CBC with Differential/Platelet - Comprehensive metabolic panel  3. Chronic kidney disease, stage 3a ([place]) - CBC with Differential/Platelet - Comprehensive metabolic panel - Ambulatory referral to Nephrology  4. Mixed hyperlipidemia-not at goal - CBC with Differential/Platelet - Comprehensive metabolic panel - Lipid panel -continue Pravastatin 80 mg QD  5. History  of candidiasis of vagina - fluconazole (DIFLUCAN) 150 MG tablet; Take 1 tablet (150 mg total) by mouth daily.  Dispense: 2 tablet; Refill: 0  6. Acquired hypothyroidism-well controlled -continue Synthroid 100 mcg QD  7. Screening mammogram for breast cancer - MM DIGITAL SCREENING BILATERAL; Future  8. Cigarette nicotine dependence with other nicotine-induced disorder - CBC with Differential/Platelet - Comprehensive metabolic panel -recommend smoking cessation  9. History of kidney cancer - CBC with Differential/Platelet - Comprehensive metabolic panel - Ambulatory referral to Nephrology  10. Class 1 obesity due to excess calories with serious comorbidity and body mass index (BMI) of 34.0 to 34.9 in adult - CBC with Differential/Platelet - Comprehensive metabolic panel - Hemoglobin A1c - Lipid panel    11. Benign hypertension with stage 3a chronic kidney disease ([facility]) - CBC with Differential/Platelet - Comprehensive metabolic panel      Continue medications  We will call you with lab results, mammogram, and kidney specialist appointment Continue to decrease smoking cigarettes Follow-up in 53-month    Follow-up: 385-month An After Visit Summary was printed and given to the patient.  I, ShRip HarbourNP, have reviewed all documentation for this visit. The documentation on 10/22/21 for the exam, diagnosis, procedures, and orders are all accurate and complete.    Signed, ShRip HarbourNP [phone]

## 2021-10-23 LAB — HEMOGLOBIN A1C
Est. average glucose Bld gHb Est-mCnc: 114 mg/dL
Hgb A1c MFr Bld: 5.6 % (ref 4.8–5.6)

## 2021-10-23 LAB — CBC WITH DIFFERENTIAL/PLATELET
Basophils Absolute: 0.1 10*3/uL (ref 0.0–0.2)
Basos: 1 %
EOS (ABSOLUTE): 0.1 10*3/uL (ref 0.0–0.4)
Eos: 2 %
Hematocrit: 41.3 % (ref 34.0–46.6)
Hemoglobin: 13.2 g/dL (ref 11.1–15.9)
Immature Grans (Abs): 0 10*3/uL (ref 0.0–0.1)
Immature Granulocytes: 0 %
Lymphocytes Absolute: 2.2 10*3/uL (ref 0.7–3.1)
Lymphs: 34 %
MCH: 30.3 pg (ref 26.6–33.0)
MCHC: 32 g/dL (ref 31.5–35.7)
MCV: 95 fL (ref 79–97)
Monocytes Absolute: 0.5 10*3/uL (ref 0.1–0.9)
Monocytes: 8 %
Neutrophils Absolute: 3.6 10*3/uL (ref 1.4–7.0)
Neutrophils: 55 %
Platelets: 229 10*3/uL (ref 150–450)
RBC: 4.36 x10E6/uL (ref 3.77–5.28)
RDW: 12.5 % (ref 11.7–15.4)
WBC: 6.5 10*3/uL (ref 3.4–10.8)

## 2021-10-23 LAB — COMPREHENSIVE METABOLIC PANEL
ALT: 14 IU/L (ref 0–32)
AST: 18 IU/L (ref 0–40)
Albumin/Globulin Ratio: 1.7 (ref 1.2–2.2)
Albumin: 4.3 g/dL (ref 3.9–4.9)
Alkaline Phosphatase: 93 IU/L (ref 44–121)
BUN/Creatinine Ratio: 11 — ABNORMAL LOW (ref 12–28)
BUN: 14 mg/dL (ref 8–27)
Bilirubin Total: 0.4 mg/dL (ref 0.0–1.2)
CO2: 24 mmol/L (ref 20–29)
Calcium: 9.7 mg/dL (ref 8.7–10.3)
Chloride: 100 mmol/L (ref 96–106)
Creatinine, Ser: 1.32 mg/dL — ABNORMAL HIGH (ref 0.57–1.00)
Globulin, Total: 2.5 g/dL (ref 1.5–4.5)
Glucose: 90 mg/dL (ref 70–99)
Potassium: 4.8 mmol/L (ref 3.5–5.2)
Sodium: 139 mmol/L (ref 134–144)
Total Protein: 6.8 g/dL (ref 6.0–8.5)
eGFR: 46 mL/min/{1.73_m2} — ABNORMAL LOW (ref >59)

## 2021-10-23 LAB — LIPID PANEL
Chol/HDL Ratio: 4 ratio (ref 0.0–4.4)
Cholesterol, Total: 174 mg/dL (ref 100–199)
HDL: 44 mg/dL (ref >39)
LDL Chol Calc (NIH): 93 mg/dL (ref 0–99)
Triglycerides: 216 mg/dL — ABNORMAL HIGH (ref 0–149)
VLDL Cholesterol Cal: 37 mg/dL (ref 5–40)

## 2021-10-23 LAB — CARDIOVASCULAR RISK ASSESSMENT

## 2021-11-17 ENCOUNTER — Other Ambulatory Visit: Payer: Self-pay | Admitting: Nurse Practitioner

## 2021-11-17 DIAGNOSIS — N1831 Chronic kidney disease, stage 3a: Secondary | ICD-10-CM

## 2021-12-16 ENCOUNTER — Ambulatory Visit
Admission: RE | Admit: 2021-12-16 | Discharge: 2021-12-16 | Disposition: A | Discharge status: Home / Self Care | Payer: 59 | Source: Ambulatory Visit | Attending: Nurse Practitioner | Admitting: Nurse Practitioner

## 2021-12-16 DIAGNOSIS — Z1231 Encounter for screening mammogram for malignant neoplasm of breast: Secondary | ICD-10-CM

## 2022-01-05 ENCOUNTER — Other Ambulatory Visit: Payer: Self-pay | Admitting: Nurse Practitioner

## 2022-01-05 DIAGNOSIS — E782 Mixed hyperlipidemia: Secondary | ICD-10-CM

## 2022-01-28 ENCOUNTER — Encounter: Payer: Self-pay | Admitting: Nurse Practitioner

## 2022-01-28 ENCOUNTER — Ambulatory Visit (INDEPENDENT_AMBULATORY_CARE_PROVIDER_SITE_OTHER): Payer: 59 | Admitting: Nurse Practitioner

## 2022-01-28 VITALS — BP 130/84 | HR 71 | Temp 97.3°F | Ht 65.0 in | Wt 221.2 lb

## 2022-01-28 DIAGNOSIS — R300 Dysuria: Secondary | ICD-10-CM

## 2022-01-28 DIAGNOSIS — R7303 Prediabetes: Secondary | ICD-10-CM

## 2022-01-28 DIAGNOSIS — E039 Hypothyroidism, unspecified: Secondary | ICD-10-CM

## 2022-01-28 DIAGNOSIS — K219 Gastro-esophageal reflux disease without esophagitis: Secondary | ICD-10-CM

## 2022-01-28 DIAGNOSIS — N1831 Chronic kidney disease, stage 3a: Secondary | ICD-10-CM | POA: Diagnosis not present

## 2022-01-28 DIAGNOSIS — I129 Hypertensive chronic kidney disease with stage 1 through stage 4 chronic kidney disease, or unspecified chronic kidney disease: Secondary | ICD-10-CM

## 2022-01-28 DIAGNOSIS — E782 Mixed hyperlipidemia: Secondary | ICD-10-CM | POA: Diagnosis not present

## 2022-01-28 DIAGNOSIS — G5793 Unspecified mononeuropathy of bilateral lower limbs: Secondary | ICD-10-CM

## 2022-01-28 DIAGNOSIS — N3001 Acute cystitis with hematuria: Secondary | ICD-10-CM

## 2022-01-28 DIAGNOSIS — Z8619 Personal history of other infectious and parasitic diseases: Secondary | ICD-10-CM

## 2022-01-28 DIAGNOSIS — G4700 Insomnia, unspecified: Secondary | ICD-10-CM

## 2022-01-28 LAB — POCT URINALYSIS DIP (CLINITEK)
Bilirubin, UA: NEGATIVE
Glucose, UA: NEGATIVE mg/dL
Ketones, POC UA: NEGATIVE mg/dL
Nitrite, UA: POSITIVE — AB
POC PROTEIN,UA: 30 — AB
Spec Grav, UA: 1.025 (ref 1.010–1.025)
Urobilinogen, UA: 0.2 E.U./dL
pH, UA: 6 (ref 5.0–8.0)

## 2022-01-28 MED ORDER — TRAZODONE HCL 150 MG PO TABS: 150.0000 mg | ORAL_TABLET | Freq: Every day | ORAL | 1 refills | Status: DC

## 2022-01-28 MED ORDER — GABAPENTIN 100 MG PO CAPS: 100.0000 mg | ORAL_CAPSULE | Freq: Every day | ORAL | 3 refills | Status: DC

## 2022-01-28 MED ORDER — OMEPRAZOLE 40 MG PO CPDR: 40.0000 mg | DELAYED_RELEASE_CAPSULE | Freq: Every day | ORAL | 3 refills | Status: DC

## 2022-01-28 MED ORDER — AMLODIPINE BESYLATE 5 MG PO TABS: 5.0000 mg | ORAL_TABLET | Freq: Every day | ORAL | 1 refills | Status: DC

## 2022-01-28 MED ORDER — PRAVASTATIN SODIUM 80 MG PO TABS: ORAL_TABLET | ORAL | 3 refills | Status: DC

## 2022-01-28 MED ORDER — FLUCONAZOLE 150 MG PO TABS: 150.0000 mg | ORAL_TABLET | ORAL | 0 refills | Status: DC

## 2022-01-28 MED ORDER — LOSARTAN POTASSIUM 100 MG PO TABS: 100.0000 mg | ORAL_TABLET | Freq: Every day | ORAL | 3 refills | Status: DC

## 2022-01-28 MED ORDER — AMOXICILLIN-POT CLAVULANATE 875-125 MG PO TABS: 1.0000 | ORAL_TABLET | Freq: Two times a day (BID) | ORAL | 0 refills | Status: DC

## 2022-01-28 NOTE — Patient Instructions (Addendum)
Push fluids, especially water Take Augmentin twice daily with food for 7 days We will call you with labs Continue medications as prescribed Follow-up in 28-month, fasting  Urinary Tract Infection, Adult A urinary tract infection (UTI) is an infection of any part of the urinary tract. The urinary tract includes: The kidneys. The ureters. The bladder. The urethra. These organs make, store, and get rid of pee (urine) in the body. What are the causes? This infection is caused by germs (bacteria) in your genital area. These germs grow and cause swelling (inflammation) of your urinary tract. What increases the risk? The following factors may make you more likely to develop this condition: Using a small, thin tube (catheter) to drain pee. Not being able to control when you pee or poop (incontinence). Being female. If you are female, these things can increase the risk: Using these methods to prevent pregnancy: A medicine that kills sperm (spermicide). A device that blocks sperm (diaphragm). Having low levels of a female hormone (estrogen). Being pregnant. You are more likely to develop this condition if: You have genes that add to your risk. You are sexually active. You take antibiotic medicines. You have trouble peeing because of: A prostate that is bigger than normal, if you are female. A blockage in the part of your body that drains pee from the bladder. A kidney stone. A nerve condition that affects your bladder. Not getting enough to drink. Not peeing often enough. You have other conditions, such as: Diabetes. A weak disease-fighting system (immune system). Sickle cell disease. Gout. Injury of the spine. What are the signs or symptoms? Symptoms of this condition include: Needing to pee right away. Peeing small amounts often. Pain or burning when peeing. Blood in the pee. Pee that smells bad or not like normal. Trouble peeing. Pee that is cloudy. Fluid coming from the  vagina, if you are female. Pain in the belly or lower back. Other symptoms include: Vomiting. Not feeling hungry. Feeling mixed up (confused). This may be the first symptom in older adults. Being tired and grouchy (irritable). A fever. Watery poop (diarrhea). How is this treated? Taking antibiotic medicine. Taking other medicines. Drinking enough water. In some cases, you may need to see a specialist. Follow these instructions at home:  Medicines Take over-the-counter and prescription medicines only as told by your doctor. If you were prescribed an antibiotic medicine, take it as told by your doctor. Do not stop taking it even if you start to feel better. General instructions Make sure you: Pee until your bladder is empty. Do not hold pee for a long time. Empty your bladder after sex. Wipe from front to back after peeing or pooping if you are a female. Use each tissue one time when you wipe. Drink enough fluid to keep your pee pale yellow. Keep all follow-up visits. Contact a doctor if: You do not get better after 1-2 days. Your symptoms go away and then come back. Get help right away if: You have very bad back pain. You have very bad pain in your lower belly. You have a fever. You have chills. You feeling like you will vomit or you vomit. Summary A urinary tract infection (UTI) is an infection of any part of the urinary tract. This condition is caused by germs in your genital area. There are many risk factors for a UTI. Treatment includes antibiotic medicines. Drink enough fluid to keep your pee pale yellow. This information is not intended to replace advice given to you  by your health care provider. Make sure you discuss any questions you have with your health care provider. Document Revised: 08/17/2019 Document Reviewed: 08/17/2019 Elsevier Patient Education  [place]. Hematuria, Adult Hematuria is blood in the urine. Blood may be visible in the urine, or it  may be identified with a test. This condition can be caused by infections of the bladder, urethra, kidney, or prostate. Other possible causes include: Kidney stones. Cancer of the urinary tract. Too much calcium in the urine. Conditions that are passed from parent to child (inherited conditions). Exercise that requires a lot of energy. Infections can usually be treated with medicine, and a kidney stone usually will pass through your urine. If neither of these is the cause of your hematuria, more tests may be needed to identify the cause of your symptoms. It is very important to tell your health care provider about any blood in your urine, even if it is painless or the blood stops without treatment. Blood in the urine, when it happens and then stops and then happens again, can be a symptom of a very serious condition, including cancer. There is no pain in the initial stages of many urinary cancers. Follow these instructions at home: Medicines Take over-the-counter and prescription medicines only as told by your health care provider. If you were prescribed an antibiotic medicine, take it as told by your health care provider. Do not stop taking the antibiotic even if you start to feel better. Eating and drinking Drink enough fluid to keep your urine pale yellow. It is recommended that you drink 3-4 quarts (2.8-3.8 L) a day. If you have been diagnosed with an infection, drinking cranberry juice in addition to large amounts of water is recommended. Avoid caffeine, tea, and carbonated beverages. These tend to irritate the bladder. Avoid alcohol because it may irritate the prostate (in males). General instructions If you have been diagnosed with a kidney stone, follow your health care provider's instructions about straining your urine to catch the stone. Empty your bladder often. Avoid holding urine for long periods of time. If you are female: After a bowel movement, wipe from front to back and use each  piece of toilet paper only once. Empty your bladder before and after sex. Pay attention to any changes in your symptoms. Tell your health care provider about any changes or any new symptoms. It is up to you to get the results of any tests. Ask your health care provider, or the department that is doing the test, when your results will be ready. Keep all follow-up visits. This is important. Contact a health care provider if: You develop back pain. You have a fever or chills. You have nausea or vomiting. Your symptoms do not improve after 3 days. Your symptoms get worse. Get help right away if: You develop severe vomiting and are unable to take medicine without vomiting. You develop severe pain in your back or abdomen even though you are taking medicine. You pass a large amount of blood in your urine. You pass blood clots in your urine. You feel very weak or like you might faint. You faint. Summary Hematuria is blood in the urine. It has many possible causes. It is very important that you tell your health care provider about any blood in your urine, even if it is painless or the blood stops without treatment. Take over-the-counter and prescription medicines only as told by your health care provider. Drink enough fluid to keep your urine pale yellow.  This information is not intended to replace advice given to you by your health care provider. Make sure you discuss any questions you have with your health care provider. Document Revised: 09/05/2019 Document Reviewed: 09/05/2019 Elsevier Patient Education  [place].

## 2022-01-28 NOTE — Progress Notes (Signed)
Subjective:  Patient ID: Sandy Beck, female    DOB: [birth year]  Age: 62 y.o. MRN: [id number]  Chief Complaint  Patient presents with   Hypothyroidism   Hypertension    HPI Pt presents for follow-up of chronic medical problems of hypertension, prediabetes, and hyperlipidemia. Pt has a history of renal cancer with left nephrectomy. States she has an upcoming Abd and chest CT scheduled for surveillance. She is a chronic cigarette smoker. Recently retired. Concerned about weight gain.Current weight 221 lbs and BMI 36.81. Nikitta tells me she has experienced dysuria the past few days. Denies fever, chills, or flank pain.   Hypertension, follow-up: She was last seen for hypertension 3 months ago.  BP at that visit was 124/74. Management includes Amlodipine 5 mg daily and losartan 100 mg daily.. She reports good compliance with treatment. She is having side effects.  She is following a Regular diet. She is exercising. She does smoke. Use of agents associated with hypertension: thyroid hormones.  Outside blood pressures are not being checked   Pertinent labs: Lab Results  Component Value Date   CHOL 174 10/22/2021   HDL 44 10/22/2021   LDLCALC 93 10/22/2021   TRIG 216 (H) 10/22/2021   CHOLHDL 4.0 10/22/2021   Lab Results  Component Value Date   NA 139 10/22/2021   K 4.8 10/22/2021   CREATININE 1.32 (H) 10/22/2021   EGFR 46 (L) 10/22/2021   GLUCOSE 90 10/22/2021         Lipid/Cholesterol, Follow-up  Last lipid panel Other pertinent labs  Lab Results  Component Value Date   CHOL 174 10/22/2021   HDL 44 10/22/2021   LDLCALC 93 10/22/2021   TRIG 216 (H) 10/22/2021   CHOLHDL 4.0 10/22/2021   Lab Results  Component Value Date   ALT 14 10/22/2021   AST 18 10/22/2021   PLT 229 10/22/2021   TSH 4.230 07/16/2021     She was last seen for this 3 months ago.  Management includes Pravastatin 80 mg She reports excellent compliance with treatment. She is not having side  effects.  Current diet: in general, a "healthy" diet   Current exercise: none  The 10-year ASCVD risk score (Arnett DK, et al., 2019) is: 12.2%   Prediabetes, Follow-up  Lab Results  Component Value Date   HGBA1C 5.6 10/22/2021   HGBA1C 5.8 (H) 07/16/2021   HGBA1C 5.7 (H) 03/12/2021   GLUCOSE 90 10/22/2021   GLUCOSE 137 (H) 07/16/2021   GLUCOSE 126 (H) 03/12/2021    Last seen for for this3 months ago.  Management since that visit includes diet modification. Current symptoms include none and have been stable.  Pertinent Labs:    Component Value Date/Time   CHOL 174 10/22/2021 0831   TRIG 216 (H) 10/22/2021 0831   CHOLHDL 4.0 10/22/2021 0831   CREATININE 1.32 (H) 10/22/2021 0831    Wt Readings from Last 3 Encounters:  01/28/22 221 lb 3.2 oz (100.3 kg)  10/22/21 210 lb (95.3 kg)  07/16/21 210 lb (95.3 kg)      Current Outpatient Medications on File Prior to Visit  Medication Sig Dispense Refill   amLODipine (NORVASC) 5 MG tablet Take 1 tablet (5 mg total) by mouth daily. 90 tablet 1   estradiol (ESTRACE VAGINAL) 0.1 MG/GM vaginal cream Apply small pea-size amount to vaginal area nightly for 14 days, then use twice weekly 42.5 g 12   fluconazole (DIFLUCAN) 150 MG tablet Take 1 tablet (150 mg total) by mouth daily. 2  tablet 0   gabapentin (NEURONTIN) 100 MG capsule Take 1 capsule (100 mg total) by mouth at bedtime. 90 capsule 3   levothyroxine (SYNTHROID) 100 MCG tablet Take 1 tablet (100 mcg total) by mouth daily before breakfast. 90 tablet 3   losartan (COZAAR) 100 MG tablet TAKE 1 TABLET BY MOUTH DAILY 90 tablet 3   omeprazole (PRILOSEC) 40 MG capsule Take 1 capsule (40 mg total) by mouth daily. 90 capsule 3   pravastatin (PRAVACHOL) 80 MG tablet TAKE 1 TABLET BY MOUTH DAILY 90 tablet 3   traZODone (DESYREL) 150 MG tablet Take 1 tablet (150 mg total) by mouth at bedtime. 90 tablet 1   No current facility-administered medications on file prior to visit.   Past Medical  History:  Diagnosis Date   Allergy    Arthritis    Cancer ([place]) 2022   left renal cancer, had left nephrectomy   GERD (gastroesophageal reflux disease)    Heart murmur    Hyperlipidemia    Hypertension    Sleep apnea    not currently using cpap   Thyroid disease    Past Surgical History:  Procedure Laterality Date   LAPAROTOMY  1980s   tubal pregnancy, left fallopian tube removal   NEPHRECTOMY Left     Family History  Problem Relation Age of Onset   Alzheimer's disease Mother    Diabetes Mellitus II Mother    Hypertension Father    Stroke Sister    Hypertension Sister    Breast cancer Neg Hx    Social History   Socioeconomic History   Marital status: Married    Spouse name: Maryfer Tauzin   Number of children: 1   Years of education: Not on file   Highest education level: Not on file  Occupational History   Occupation: Scientist, water quality   Occupation: Retired  Tobacco Use   Smoking status: Every Day    Packs/day: 0.50    Years: 45.00    Total pack years: 22.50    Types: Cigarettes   Smokeless tobacco: Never  Vaping Use   Vaping Use: Never used  Substance and Sexual Activity   Alcohol use: Not Currently   Drug use: Never   Sexual activity: Not on file  Other Topics Concern   Not on file  Social History Narrative   Not on file   Social Determinants of Health   Financial Resource Strain: Low Risk  (07/16/2021)   Overall Financial Resource Strain (CARDIA)    Difficulty of Paying Living Expenses: Not hard at all  Food Insecurity: No Food Insecurity (07/16/2021)   Hunger Vital Sign    Worried About Running Out of Food in the Last Year: Never true    Ran Out of Food in the Last Year: Never true  Transportation Needs: No Transportation Needs (07/16/2021)   PRAPARE - Hydrologist (Medical): No    Lack of Transportation (Non-Medical): No  Physical Activity: Sufficiently Active (07/16/2021)   Exercise Vital Sign    Days of Exercise per Week: 5  days    Minutes of Exercise per Session: 30 min  Stress: No Stress Concern Present (07/16/2021)   [place]    Feeling of Stress : Not at all  Social Connections: Moderately Isolated (07/16/2021)   Social Connection and Isolation Panel [NHANES]    Frequency of Communication with Friends and Family: More than three times a week    Frequency of  Social Gatherings with Friends and Family: More than three times a week    Attends Religious Services: Never    Marine scientist or Organizations: No    Attends Archivist Meetings: Never    Marital Status: Married    Review of Systems  Constitutional:  Positive for unexpected weight change (weight gain). Negative for fatigue.  HENT:  Negative for congestion, ear pain and sore throat.   Respiratory:  Negative for cough and shortness of breath.   Cardiovascular:  Negative for chest pain.  Gastrointestinal:  Negative for abdominal pain, constipation, diarrhea, nausea and vomiting.  Genitourinary:  Positive for dysuria. Negative for frequency and urgency.  Musculoskeletal:  Negative for arthralgias, back pain and myalgias.  Neurological:  Negative for dizziness and headaches.  Psychiatric/Behavioral:  Negative for agitation and sleep disturbance. The patient is not nervous/anxious.      Objective:  BP 130/84 (BP Location: Left Arm, Patient Position: Sitting, Cuff Size: Large)   Pulse 71   Temp (!) 97.3 F (36.3 C) (Temporal)   Ht '5\' 5"'$  (1.651 m)   Wt 221 lb 3.2 oz (100.3 kg)   SpO2 98%   BMI 36.81 kg/m       10/22/2021    7:55 AM 07/16/2021    9:50 AM 04/09/2021   10:36 AM  BP/Weight  Systolic BP 347 425 956  Diastolic BP 74 80 90  Wt. (Lbs) 210 210 206  BMI 34.95 kg/m2 34.95 kg/m2 34.28 kg/m2    Physical Exam Vitals reviewed.  Constitutional:      Appearance: She is obese.  HENT:     Right Ear: Tympanic membrane normal.     Left Ear: Tympanic  membrane normal.     Nose: No congestion or rhinorrhea.     Mouth/Throat:     Pharynx: No oropharyngeal exudate or posterior oropharyngeal erythema.  Cardiovascular:     Rate and Rhythm: Normal rate and regular rhythm.     Heart sounds: Normal heart sounds.  Pulmonary:     Effort: Pulmonary effort is normal.     Breath sounds: Normal breath sounds.  Abdominal:     General: Bowel sounds are normal.     Palpations: Abdomen is soft.     Tenderness: There is no abdominal tenderness.  Lymphadenopathy:     Cervical: No cervical adenopathy.  Skin:    General: Skin is warm and dry.     Capillary Refill: Capillary refill takes less than 2 seconds.  Neurological:     General: No focal deficit present.     Mental Status: She is alert and oriented to person, place, and time.  Psychiatric:        Mood and Affect: Mood normal.        Behavior: Behavior normal.         Lab Results  Component Value Date   WBC 6.5 10/22/2021   HGB 13.2 10/22/2021   HCT 41.3 10/22/2021   PLT 229 10/22/2021   GLUCOSE 90 10/22/2021   CHOL 174 10/22/2021   TRIG 216 (H) 10/22/2021   HDL 44 10/22/2021   LDLCALC 93 10/22/2021   ALT 14 10/22/2021   AST 18 10/22/2021   NA 139 10/22/2021   K 4.8 10/22/2021   CL 100 10/22/2021   CREATININE 1.32 (H) 10/22/2021   BUN 14 10/22/2021   CO2 24 10/22/2021   TSH 4.230 07/16/2021   HGBA1C 5.6 10/22/2021      Assessment & Plan:  1. Prediabetes -  CBC with Differential/Platelet - Comprehensive metabolic panel - Hemoglobin A1c - Lipid panel - T4, free - TSH  2. Gastroesophageal reflux disease, unspecified whether esophagitis present - omeprazole (PRILOSEC) 40 MG capsule; Take 1 capsule (40 mg total) by mouth daily.  Dispense: 90 capsule; Refill: 3 - CBC with Differential/Platelet - Comprehensive metabolic panel  3. Chronic kidney disease, stage 3a ([place]) - CBC with Differential/Platelet - Comprehensive metabolic panel  4. Mixed hyperlipidemia-not at  goal - pravastatin (PRAVACHOL) 80 MG tablet; TAKE 1 TABLET BY MOUTH DAILY  Dispense: 90 tablet; Refill: 3 - CBC with Differential/Platelet - Comprehensive metabolic panel - Lipid panel  5. Acquired hypothyroidism-well controlled - T4, free - TSH -continue Levothyroxine 100 mcg QD  6. Insomnia, unspecified type - traZODone (DESYREL) 150 MG tablet; Take 1 tablet (150 mg total) by mouth at bedtime.  Dispense: 90 tablet; Refill: 1 - T4, free - TSH  7. Neuropathy of both feet - gabapentin (NEURONTIN) 100 MG capsule; Take 1 capsule (100 mg total) by mouth at bedtime.  Dispense: 90 capsule; Refill: 3 - CBC with Differential/Platelet - Comprehensive metabolic panel  8. Benign hypertension with stage 3a chronic kidney disease (HCC)-well controlled - losartan (COZAAR) 100 MG tablet; Take 1 tablet (100 mg total) by mouth daily.  Dispense: 90 tablet; Refill: 3 - amLODipine (NORVASC) 5 MG tablet; Take 1 tablet (5 mg total) by mouth daily.  Dispense: 90 tablet; Refill: 1 - CBC with Differential/Platelet - Comprehensive metabolic panel  9. Acute cystitis with hematuria - Urine Culture - amoxicillin-clavulanate (AUGMENTIN) 875-125 MG tablet; Take 1 tablet by mouth 2 (two) times daily.  Dispense: 20 tablet; Refill: 0 -push fluids, especially water  10. History of candidiasis of vagina - fluconazole (DIFLUCAN) 150 MG tablet; Take 1 tablet (150 mg total) by mouth every 3 (three) days.  Dispense: 2 tablet; Refill: 0  11. Dysuria - POCT URINALYSIS DIP (CLINITEK)   Push fluids, especially water Take Augmentin twice daily with food for 7 days We will call you with labs Continue medications as prescribed Follow-up in 76-month, fasting    Follow-up: 318-month fasting  An After Visit Summary was printed and given to the patient.  I, ShRip HarbourNP, have reviewed all documentation for this visit. The documentation on 01/28/22 for the exam, diagnosis, procedures, and orders are all  accurate and complete.     ShRip HarbourNP [phone]

## 2022-01-29 LAB — CBC WITH DIFFERENTIAL/PLATELET
Basophils Absolute: 0.1 10*3/uL (ref 0.0–0.2)
Basos: 1 %
EOS (ABSOLUTE): 0.1 10*3/uL (ref 0.0–0.4)
Eos: 2 %
Hematocrit: 40.8 % (ref 34.0–46.6)
Hemoglobin: 13.4 g/dL (ref 11.1–15.9)
Immature Grans (Abs): 0 10*3/uL (ref 0.0–0.1)
Immature Granulocytes: 0 %
Lymphocytes Absolute: 2.4 10*3/uL (ref 0.7–3.1)
Lymphs: 33 %
MCH: 31.5 pg (ref 26.6–33.0)
MCHC: 32.8 g/dL (ref 31.5–35.7)
MCV: 96 fL (ref 79–97)
Monocytes Absolute: 0.5 10*3/uL (ref 0.1–0.9)
Monocytes: 7 %
Neutrophils Absolute: 4.2 10*3/uL (ref 1.4–7.0)
Neutrophils: 57 %
Platelets: 267 10*3/uL (ref 150–450)
RBC: 4.26 x10E6/uL (ref 3.77–5.28)
RDW: 12.8 % (ref 11.7–15.4)
WBC: 7.3 10*3/uL (ref 3.4–10.8)

## 2022-01-29 LAB — COMPREHENSIVE METABOLIC PANEL
ALT: 19 IU/L (ref 0–32)
AST: 21 IU/L (ref 0–40)
Albumin/Globulin Ratio: 1.7 (ref 1.2–2.2)
Albumin: 4.3 g/dL (ref 3.9–4.9)
Alkaline Phosphatase: 99 IU/L (ref 44–121)
BUN/Creatinine Ratio: 11 — ABNORMAL LOW (ref 12–28)
BUN: 15 mg/dL (ref 8–27)
Bilirubin Total: 0.5 mg/dL (ref 0.0–1.2)
CO2: 22 mmol/L (ref 20–29)
Calcium: 9.5 mg/dL (ref 8.7–10.3)
Chloride: 100 mmol/L (ref 96–106)
Creatinine, Ser: 1.4 mg/dL — ABNORMAL HIGH (ref 0.57–1.00)
Globulin, Total: 2.5 g/dL (ref 1.5–4.5)
Glucose: 88 mg/dL (ref 70–99)
Potassium: 4.3 mmol/L (ref 3.5–5.2)
Sodium: 136 mmol/L (ref 134–144)
Total Protein: 6.8 g/dL (ref 6.0–8.5)
eGFR: 42 mL/min/{1.73_m2} — ABNORMAL LOW (ref >59)

## 2022-01-29 LAB — T4, FREE: Free T4: 1.46 ng/dL (ref 0.82–1.77)

## 2022-01-29 LAB — LIPID PANEL
Chol/HDL Ratio: 4.1 ratio (ref 0.0–4.4)
Cholesterol, Total: 183 mg/dL (ref 100–199)
HDL: 45 mg/dL (ref >39)
LDL Chol Calc (NIH): 102 mg/dL — ABNORMAL HIGH (ref 0–99)
Triglycerides: 209 mg/dL — ABNORMAL HIGH (ref 0–149)
VLDL Cholesterol Cal: 36 mg/dL (ref 5–40)

## 2022-01-29 LAB — TSH: TSH: 4.5 u[IU]/mL (ref 0.450–4.500)

## 2022-01-29 LAB — HEMOGLOBIN A1C
Est. average glucose Bld gHb Est-mCnc: 123 mg/dL
Hgb A1c MFr Bld: 5.9 % — ABNORMAL HIGH (ref 4.8–5.6)

## 2022-01-29 LAB — CARDIOVASCULAR RISK ASSESSMENT

## 2022-01-31 LAB — URINE CULTURE

## 2022-04-28 ENCOUNTER — Ambulatory Visit: Payer: 59 | Admitting: Physician Assistant

## 2022-05-05 ENCOUNTER — Ambulatory Visit: Payer: 59 | Admitting: Nurse Practitioner

## 2022-05-27 ENCOUNTER — Ambulatory Visit (INDEPENDENT_AMBULATORY_CARE_PROVIDER_SITE_OTHER): Payer: 59 | Admitting: Physician Assistant

## 2022-05-27 ENCOUNTER — Encounter: Payer: Self-pay | Admitting: Physician Assistant

## 2022-05-27 VITALS — BP 130/80 | HR 74 | Temp 98.3°F | Resp 12 | Ht 65.0 in | Wt 220.0 lb

## 2022-05-27 DIAGNOSIS — K219 Gastro-esophageal reflux disease without esophagitis: Secondary | ICD-10-CM | POA: Insufficient documentation

## 2022-05-27 DIAGNOSIS — E782 Mixed hyperlipidemia: Secondary | ICD-10-CM | POA: Diagnosis not present

## 2022-05-27 DIAGNOSIS — I129 Hypertensive chronic kidney disease with stage 1 through stage 4 chronic kidney disease, or unspecified chronic kidney disease: Secondary | ICD-10-CM | POA: Diagnosis not present

## 2022-05-27 DIAGNOSIS — B3731 Acute candidiasis of vulva and vagina: Secondary | ICD-10-CM

## 2022-05-27 DIAGNOSIS — R7303 Prediabetes: Secondary | ICD-10-CM

## 2022-05-27 DIAGNOSIS — N390 Urinary tract infection, site not specified: Secondary | ICD-10-CM | POA: Diagnosis not present

## 2022-05-27 DIAGNOSIS — E039 Hypothyroidism, unspecified: Secondary | ICD-10-CM | POA: Insufficient documentation

## 2022-05-27 DIAGNOSIS — N1831 Chronic kidney disease, stage 3a: Secondary | ICD-10-CM | POA: Insufficient documentation

## 2022-05-27 DIAGNOSIS — G4700 Insomnia, unspecified: Secondary | ICD-10-CM | POA: Insufficient documentation

## 2022-05-27 LAB — POCT URINALYSIS DIP (CLINITEK)
Bilirubin, UA: NEGATIVE
Blood, UA: NEGATIVE
Glucose, UA: NEGATIVE mg/dL
Ketones, POC UA: NEGATIVE mg/dL
Nitrite, UA: NEGATIVE
POC PROTEIN,UA: NEGATIVE
Spec Grav, UA: 1.025 (ref 1.010–1.025)
Urobilinogen, UA: 0.2 E.U./dL
pH, UA: 6 (ref 5.0–8.0)

## 2022-05-27 MED ORDER — FLUCONAZOLE 150 MG PO TABS
ORAL_TABLET | ORAL | 0 refills | Status: DC
Start: 2022-05-27 — End: 2022-06-02

## 2022-05-27 NOTE — Assessment & Plan Note (Signed)
Well controlled.  Continue to work on eating a healthy diet and exercise.  Labs drawn today.   No major side effects reported, and no issues with compliance. The current medical regimen is effective;  continue present plan with Omeprazole 40mg 

## 2022-05-27 NOTE — Assessment & Plan Note (Signed)
Well controlled.  Continue to work on eating a healthy diet and exercise.  Labs drawn today.   No major side effects reported, and no issues with compliance. The current medical regimen is effective;  continue present plan with Pravastatin 80mg 

## 2022-05-27 NOTE — Assessment & Plan Note (Signed)
Well controlled.  Continue to work on eating a healthy diet and exercise.  Labs drawn today.   No major side effects reported, and no issues with compliance. The current medical regimen is effective;  continue present plan with Levothyroxine 

## 2022-05-27 NOTE — Assessment & Plan Note (Signed)
The current medical regimen is effective;  continue present plan and medications.  

## 2022-05-27 NOTE — Assessment & Plan Note (Signed)
Well controlled.  Continue to work on eating a healthy diet and exercise.  Labs drawn today.   No major side effects reported, and no issues with compliance. The current medical regimen is effective;  continue present plan with Amlodipine 5mg , Losartan 100mg 

## 2022-05-27 NOTE — Progress Notes (Signed)
Subjective:  Patient ID: Sandy Beck, female    DOB: 1960/05/13  Age: 62 y.o. MRN: [id number]  Chief Complaint  Patient presents with   Medical Management of Chronic Issues    HPI   Pt presents for follow-up of chronic medical problems of hypertension, prediabetes, and hyperlipidemia. Pt has a history of renal cancer with left nephrectomy.  She is a chronic cigarette smoker. Recently retired. Shakelia tells me she has experienced vaginal discharge the past few days after she took antibiotics. Denies fever, chills, or flank pain. Will prescribe 2 doses of Diflucan 150 mg tablet as patient stated that has worked best for her in the past.  Will continue to monitor as this is her second treatment for bacterial vaginosis this year.  Hypertension, follow-up: She was last seen for hypertension 3 months ago.  BP at that visit was 124/74. Management includes Amlodipine 5 mg daily and losartan 100 mg daily.. She reports good compliance with treatment. She is having side effects.  She is following a Regular diet. She is exercising. She does smoke. Use of agents associated with hypertension: thyroid hormones.  Outside blood pressures are not being checked   Lipid/Cholesterol, Follow-up  She was last seen for this 3 months ago.  Management includes Pravastatin 80 mg She reports excellent compliance with treatment. She is not having side effects.  Current diet: in general, a "healthy" diet   Current exercise: none  The 10-year ASCVD risk score (Arnett DK, et al., 2019) is: 12.2%   Prediabetes, Follow-up  Last seen for for this3 months ago.  Management since that visit includes diet modification. Current symptoms include none and have been stable.     05/27/2022    8:57 AM 01/28/2022    7:32 AM 10/22/2021    7:58 AM 07/16/2021    9:59 AM 03/12/2021   10:19 AM  Depression screen PHQ 2/9  Decreased Interest 1 0 0 0 0  Down, Depressed, Hopeless 0 0 0 0 0  PHQ - 2 Score 1 0 0 0 0   Altered sleeping 1  0    Tired, decreased energy 2  2    Change in appetite 0  0    Feeling bad or failure about yourself  0  0    Trouble concentrating 0  0    Moving slowly or fidgety/restless 0  0    Suicidal thoughts 0  0    PHQ-9 Score 4  2    Difficult doing work/chores Not difficult at all  Not difficult at all          05/27/2022    8:57 AM  Fall Risk   Falls in the past year? 0  Number falls in past yr: 0  Injury with Fall? 0  Risk for fall due to : No Fall Risks  Follow up Falls prevention discussed    Patient Care Team: Langley Gauss, [place] as PCP - General (Physician Assistant) Coralyn Mark, MD (Urology)   Review of Systems  Constitutional:  Negative for chills, fatigue and fever.  HENT:  Negative for congestion, ear pain and sore throat.   Respiratory:  Negative for cough and shortness of breath.   Cardiovascular:  Negative for chest pain and palpitations.  Gastrointestinal:  Negative for abdominal pain, constipation, diarrhea, nausea and vomiting.  Endocrine: Negative for polydipsia, polyphagia and polyuria.  Genitourinary:  Negative for difficulty urinating and dysuria.  Musculoskeletal:  Negative for arthralgias, back pain and myalgias.  Skin:  Negative for rash.  Neurological:  Negative for headaches.  Psychiatric/Behavioral:  Negative for dysphoric mood. The patient is not nervous/anxious.     Current Outpatient Medications on File Prior to Visit  Medication Sig Dispense Refill   amLODipine (NORVASC) 5 MG tablet Take 1 tablet (5 mg total) by mouth daily. 90 tablet 1   gabapentin (NEURONTIN) 100 MG capsule Take 1 capsule (100 mg total) by mouth at bedtime. 90 capsule 3   levothyroxine (SYNTHROID) 100 MCG tablet Take 1 tablet (100 mcg total) by mouth daily before breakfast. 90 tablet 3   losartan (COZAAR) 100 MG tablet Take 1 tablet (100 mg total) by mouth daily. 90 tablet 3   omeprazole (PRILOSEC) 40 MG capsule Take 1 capsule (40 mg total) by mouth daily.  90 capsule 3   pravastatin (PRAVACHOL) 80 MG tablet TAKE 1 TABLET BY MOUTH DAILY 90 tablet 3   traZODone (DESYREL) 150 MG tablet Take 1 tablet (150 mg total) by mouth at bedtime. 90 tablet 1   No current facility-administered medications on file prior to visit.   Past Medical History:  Diagnosis Date   Allergy    Arthritis    Cancer (HCC) 2022   left renal cancer, had left nephrectomy   GERD (gastroesophageal reflux disease)    Heart murmur    Hyperlipidemia    Hypertension    Sleep apnea    not currently using cpap   Thyroid disease    Past Surgical History:  Procedure Laterality Date   LAPAROTOMY  1980s   tubal pregnancy, left fallopian tube removal   NEPHRECTOMY Left     Family History  Problem Relation Age of Onset   Alzheimer's disease Mother    Diabetes Mellitus II Mother    Hypertension Father    Stroke Sister    Hypertension Sister    Breast cancer Neg Hx    Social History   Socioeconomic History   Marital status: Married    Spouse name: Nisreen Houseknecht   Number of children: 1   Years of education: Not on file   Highest education level: Not on file  Occupational History   Occupation: Conservation officer, nature   Occupation: Retired  Tobacco Use   Smoking status: Every Day    Packs/day: 0.25    Years: 45.00    Additional pack years: 0.00    Total pack years: 11.25    Types: Cigarettes   Smokeless tobacco: Never  Vaping Use   Vaping Use: Some days  Substance and Sexual Activity   Alcohol use: Not Currently   Drug use: Never   Sexual activity: Yes    Partners: Male  Other Topics Concern   Not on file  Social History Narrative   Not on file   Social Determinants of Health   Financial Resource Strain: Low Risk  (07/16/2021)   Overall Financial Resource Strain (CARDIA)    Difficulty of Paying Living Expenses: Not hard at all  Food Insecurity: No Food Insecurity (07/16/2021)   Hunger Vital Sign    Worried About Running Out of Food in the Last Year: Never true    Ran  Out of Food in the Last Year: Never true  Transportation Needs: No Transportation Needs (07/16/2021)   PRAPARE - Administrator, Civil Service (Medical): No    Lack of Transportation (Non-Medical): No  Physical Activity: Sufficiently Active (07/16/2021)   Exercise Vital Sign    Days of Exercise per Week: 5 days    Minutes of  Exercise per Session: 30 min  Stress: No Stress Concern Present (07/16/2021)   [employer] of Occupational Health - Occupational Stress Questionnaire    Feeling of Stress : Not at all  Social Connections: Moderately Isolated (07/16/2021)   Social Connection and Isolation Panel [NHANES]    Frequency of Communication with Friends and Family: More than three times a week    Frequency of Social Gatherings with Friends and Family: More than three times a week    Attends Religious Services: Never    Database administrator or Organizations: No    Attends Engineer, structural: Never    Marital Status: Married    Objective:  BP 130/80   Pulse 74   Temp 98.3 F (36.8 C)   Resp 12   Ht 5\' 5"  (1.651 m)   Wt 220 lb (99.8 kg)   LMP  (LMP Unknown)   SpO2 96%   BMI 36.61 kg/m      05/27/2022    8:44 AM 01/28/2022    7:30 AM 10/22/2021    7:55 AM  BP/Weight  Systolic BP 130 130 124  Diastolic BP 80 84 74  Wt. (Lbs) 220 221.2 210  BMI 36.61 kg/m2 36.81 kg/m2 34.95 kg/m2    Physical Exam Constitutional:      Appearance: Normal appearance. She is obese.  Cardiovascular:     Rate and Rhythm: Normal rate and regular rhythm.     Heart sounds: Normal heart sounds.  Pulmonary:     Effort: Pulmonary effort is normal.     Breath sounds: Normal breath sounds.  Abdominal:     General: Bowel sounds are normal.     Palpations: Abdomen is soft.  Neurological:     Mental Status: She is alert and oriented to person, place, and time.  Psychiatric:        Behavior: Behavior normal.     Diabetic Foot Exam - Simple   No data filed      Lab  Results  Component Value Date   WBC 7.1 05/27/2022   HGB 13.2 05/27/2022   HCT 40.0 05/27/2022   PLT 243 05/27/2022   GLUCOSE 91 05/27/2022   CHOL 176 05/27/2022   TRIG 134 05/27/2022   HDL 50 05/27/2022   LDLCALC 102 (H) 05/27/2022   ALT 18 05/27/2022   AST 18 05/27/2022   NA 139 05/27/2022   K 5.1 05/27/2022   CL 101 05/27/2022   CREATININE 1.43 (H) 05/27/2022   BUN 19 05/27/2022   CO2 23 05/27/2022   TSH 5.080 (H) 05/27/2022   HGBA1C 6.0 (H) 05/27/2022      Assessment & Plan:    Benign hypertension with stage 3a chronic kidney disease (HCC) Assessment & Plan: Well controlled.  Continue to work on eating a healthy diet and exercise.  Labs drawn today.   No major side effects reported, and no issues with compliance. The current medical regimen is effective;  continue present plan with Amlodipine 5mg , Losartan 100mg   Orders: -     CBC with Differential/Platelet -     Comprehensive metabolic panel  Mixed hyperlipidemia Assessment & Plan: Well controlled.  Continue to work on eating a healthy diet and exercise.  Labs drawn today.   No major side effects reported, and no issues with compliance. The current medical regimen is effective;  continue present plan with Pravastatin 80mg   Orders: -     Lipid panel  Prediabetes -     Hemoglobin A1c -  Cardiovascular Risk Assessment  Gastroesophageal reflux disease, unspecified whether esophagitis present Assessment & Plan: Well controlled.  Continue to work on eating a healthy diet and exercise.  Labs drawn today.   No major side effects reported, and no issues with compliance. The current medical regimen is effective;  continue present plan with Omeprazole 40mg    Insomnia, unspecified type Assessment & Plan: Well controlled.  Continue to work on eating a healthy diet and exercise.  Labs drawn today.   No major side effects reported, and no issues with compliance. The current medical regimen is effective;   continue present plan with Trazodone 150mg    Chronic kidney disease, stage 3a (HCC) Assessment & Plan: The current medical regimen is effective;  continue present plan and medications.    Acquired hypothyroidism Assessment & Plan: Well controlled.  Continue to work on eating a healthy diet and exercise.  Labs drawn today.   No major side effects reported, and no issues with compliance. The current medical regimen is effective;  continue present plan with Levothyroxine  Orders: -     TSH  Acute UTI -     POCT URINALYSIS DIP (CLINITEK) -     Urine Culture  Vaginal yeast infection     Meds ordered this encounter  Medications   DISCONTD: fluconazole (DIFLUCAN) 150 MG tablet    Sig: Take one pill, then take the second pill 72 hours after the first.    Dispense:  2 tablet    Refill:  0    Orders Placed This Encounter  Procedures   Urine Culture   CBC with Differential/Platelet   Comprehensive metabolic panel   Hemoglobin A1c   Lipid panel   TSH   Cardiovascular Risk Assessment   POCT URINALYSIS DIP (CLINITEK)     Follow-up: No follow-ups on file.   I,Marla I Leal-Borjas,acting as a scribe for [employer], PA.,have documented all relevant documentation on the behalf of Langley Gauss, PA,as directed by  Langley Gauss, PA while in the presence of Langley Gauss, [place].   An After Visit Summary was printed and given to the patient.  Langley Gauss, [place] [facility] [phone]

## 2022-05-27 NOTE — Assessment & Plan Note (Signed)
Well controlled.  Continue to work on eating a healthy diet and exercise.  Labs drawn today.   No major side effects reported, and no issues with compliance. The current medical regimen is effective;  continue present plan with Trazodone 150mg 

## 2022-05-28 ENCOUNTER — Encounter: Payer: Self-pay | Admitting: Physician Assistant

## 2022-05-28 ENCOUNTER — Other Ambulatory Visit: Payer: Self-pay | Admitting: Physician Assistant

## 2022-05-28 DIAGNOSIS — N390 Urinary tract infection, site not specified: Secondary | ICD-10-CM

## 2022-05-28 LAB — CBC WITH DIFFERENTIAL/PLATELET
Basophils Absolute: 0.1 10*3/uL (ref 0.0–0.2)
Basos: 1 %
EOS (ABSOLUTE): 0.1 10*3/uL (ref 0.0–0.4)
Eos: 2 %
Hematocrit: 40 % (ref 34.0–46.6)
Hemoglobin: 13.2 g/dL (ref 11.1–15.9)
Immature Grans (Abs): 0 10*3/uL (ref 0.0–0.1)
Immature Granulocytes: 0 %
Lymphocytes Absolute: 2.9 10*3/uL (ref 0.7–3.1)
Lymphs: 40 %
MCH: 31.1 pg (ref 26.6–33.0)
MCHC: 33 g/dL (ref 31.5–35.7)
MCV: 94 fL (ref 79–97)
Monocytes Absolute: 0.5 10*3/uL (ref 0.1–0.9)
Monocytes: 7 %
Neutrophils Absolute: 3.5 10*3/uL (ref 1.4–7.0)
Neutrophils: 50 %
Platelets: 243 10*3/uL (ref 150–450)
RBC: 4.24 x10E6/uL (ref 3.77–5.28)
RDW: 13.2 % (ref 11.7–15.4)
WBC: 7.1 10*3/uL (ref 3.4–10.8)

## 2022-05-28 LAB — LIPID PANEL
Chol/HDL Ratio: 3.5 ratio (ref 0.0–4.4)
Cholesterol, Total: 176 mg/dL (ref 100–199)
HDL: 50 mg/dL (ref >39)
LDL Chol Calc (NIH): 102 mg/dL — ABNORMAL HIGH (ref 0–99)
Triglycerides: 134 mg/dL (ref 0–149)
VLDL Cholesterol Cal: 24 mg/dL (ref 5–40)

## 2022-05-28 LAB — COMPREHENSIVE METABOLIC PANEL
ALT: 18 IU/L (ref 0–32)
AST: 18 IU/L (ref 0–40)
Albumin/Globulin Ratio: 1.7 (ref 1.2–2.2)
Albumin: 4.5 g/dL (ref 3.9–4.9)
Alkaline Phosphatase: 118 IU/L (ref 44–121)
BUN/Creatinine Ratio: 13 (ref 12–28)
BUN: 19 mg/dL (ref 8–27)
Bilirubin Total: 0.3 mg/dL (ref 0.0–1.2)
CO2: 23 mmol/L (ref 20–29)
Calcium: 9.7 mg/dL (ref 8.7–10.3)
Chloride: 101 mmol/L (ref 96–106)
Creatinine, Ser: 1.43 mg/dL — ABNORMAL HIGH (ref 0.57–1.00)
Globulin, Total: 2.7 g/dL (ref 1.5–4.5)
Glucose: 91 mg/dL (ref 70–99)
Potassium: 5.1 mmol/L (ref 3.5–5.2)
Sodium: 139 mmol/L (ref 134–144)
Total Protein: 7.2 g/dL (ref 6.0–8.5)
eGFR: 41 mL/min/{1.73_m2} — ABNORMAL LOW (ref >59)

## 2022-05-28 LAB — CARDIOVASCULAR RISK ASSESSMENT

## 2022-05-28 LAB — HEMOGLOBIN A1C
Est. average glucose Bld gHb Est-mCnc: 126 mg/dL
Hgb A1c MFr Bld: 6 % — ABNORMAL HIGH (ref 4.8–5.6)

## 2022-05-28 LAB — TSH: TSH: 5.08 u[IU]/mL — ABNORMAL HIGH (ref 0.450–4.500)

## 2022-05-28 MED ORDER — AMOXICILLIN 875 MG PO TABS
875.0000 mg | ORAL_TABLET | Freq: Two times a day (BID) | ORAL | 0 refills | Status: AC
Start: 2022-05-28 — End: 2022-06-07

## 2022-05-29 LAB — URINE CULTURE

## 2022-06-02 ENCOUNTER — Other Ambulatory Visit: Payer: Self-pay

## 2022-06-02 DIAGNOSIS — B3731 Acute candidiasis of vulva and vagina: Secondary | ICD-10-CM

## 2022-06-03 ENCOUNTER — Other Ambulatory Visit: Payer: Self-pay | Admitting: Physician Assistant

## 2022-06-03 MED ORDER — FLUCONAZOLE 150 MG PO TABS
ORAL_TABLET | ORAL | 0 refills | Status: DC
Start: 2022-06-03 — End: 2022-09-01

## 2022-06-08 DIAGNOSIS — B3731 Acute candidiasis of vulva and vagina: Secondary | ICD-10-CM | POA: Insufficient documentation

## 2022-07-05 ENCOUNTER — Telehealth: Payer: Self-pay

## 2022-07-05 NOTE — Telephone Encounter (Signed)
error 

## 2022-07-21 NOTE — Progress Notes (Signed)
Wrong office

## 2022-08-31 NOTE — Progress Notes (Unsigned)
Subjective:  Patient ID: Sandy Beck, female    DOB: Oct 18, 1960  Age: 62 y.o. MRN: [id number]  No chief complaint on file.   HPI  Pt presents for follow-up of chronic medical problems of hypertension, prediabetes, and hyperlipidemia.   Hypothyroidism: levothyroxine 100 mg daily.  Hyperlipidemia: Current medications:Pravastatin 80 mg   Hypertension: Complications: Current medications:Amlodipine 5 mg daily and losartan 100 mg daily.   Diet: Exercise:     05/27/2022    8:57 AM 01/28/2022    7:32 AM 10/22/2021    7:58 AM 07/16/2021    9:59 AM 03/12/2021   10:19 AM  Depression screen PHQ 2/9  Decreased Interest 1 0 0 0 0  Down, Depressed, Hopeless 0 0 0 0 0  PHQ - 2 Score 1 0 0 0 0  Altered sleeping 1  0    Tired, decreased energy 2  2    Change in appetite 0  0    Feeling bad or failure about yourself  0  0    Trouble concentrating 0  0    Moving slowly or fidgety/restless 0  0    Suicidal thoughts 0  0    PHQ-9 Score 4  2    Difficult doing work/chores Not difficult at all  Not difficult at all          05/27/2022    8:57 AM  Fall Risk   Falls in the past year? 0  Number falls in past yr: 0  Injury with Fall? 0  Risk for fall due to : No Fall Risks  Follow up Falls prevention discussed    Patient Care Team: Langley Gauss, [place] as PCP - General (Physician Assistant) Coralyn Mark, MD (Urology)   Review of Systems  Current Outpatient Medications on File Prior to Visit  Medication Sig Dispense Refill   amLODipine (NORVASC) 5 MG tablet Take 1 tablet (5 mg total) by mouth daily. 90 tablet 1   fluconazole (DIFLUCAN) 150 MG tablet Take one pill, then take the second pill 72 hours after the first. 2 tablet 0   gabapentin (NEURONTIN) 100 MG capsule Take 1 capsule (100 mg total) by mouth at bedtime. 90 capsule 3   levothyroxine (SYNTHROID) 100 MCG tablet Take 1 tablet (100 mcg total) by mouth daily before breakfast. 90 tablet 3   losartan (COZAAR) 100 MG tablet Take 1  tablet (100 mg total) by mouth daily. 90 tablet 3   omeprazole (PRILOSEC) 40 MG capsule Take 1 capsule (40 mg total) by mouth daily. 90 capsule 3   pravastatin (PRAVACHOL) 80 MG tablet TAKE 1 TABLET BY MOUTH DAILY 90 tablet 3   traZODone (DESYREL) 150 MG tablet Take 1 tablet (150 mg total) by mouth at bedtime. 90 tablet 1   No current facility-administered medications on file prior to visit.   Past Medical History:  Diagnosis Date   Allergy    Arthritis    Cancer (HCC) 2022   left renal cancer, had left nephrectomy   GERD (gastroesophageal reflux disease)    Heart murmur    Hyperlipidemia    Hypertension    Sleep apnea    not currently using cpap   Thyroid disease    Past Surgical History:  Procedure Laterality Date   LAPAROTOMY  1980s   tubal pregnancy, left fallopian tube removal   NEPHRECTOMY Left     Family History  Problem Relation Age of Onset   Alzheimer's disease Mother    Diabetes Mellitus II  Mother    Hypertension Father    Stroke Sister    Hypertension Sister    Breast cancer Neg Hx    Social History   Socioeconomic History   Marital status: Married    Spouse name: Lurae Langelier   Number of children: 1   Years of education: Not on file   Highest education level: Not on file  Occupational History   Occupation: Conservation officer, nature   Occupation: Retired  Tobacco Use   Smoking status: Every Day    Current packs/day: 0.25    Average packs/day: 0.3 packs/day for 45.0 years (11.3 ttl pk-yrs)    Types: Cigarettes   Smokeless tobacco: Never  Vaping Use   Vaping status: Some Days  Substance and Sexual Activity   Alcohol use: Not Currently   Drug use: Never   Sexual activity: Yes    Partners: Male  Other Topics Concern   Not on file  Social History Narrative   Not on file   Social Determinants of Health   Financial Resource Strain: Low Risk  (07/16/2021)   Overall Financial Resource Strain (CARDIA)    Difficulty of Paying Living Expenses: Not hard at all  Food  Insecurity: No Food Insecurity (07/16/2021)   Hunger Vital Sign    Worried About Running Out of Food in the Last Year: Never true    Ran Out of Food in the Last Year: Never true  Transportation Needs: No Transportation Needs (07/16/2021)   PRAPARE - Administrator, Civil Service (Medical): No    Lack of Transportation (Non-Medical): No  Physical Activity: Sufficiently Active (07/16/2021)   Exercise Vital Sign    Days of Exercise per Week: 5 days    Minutes of Exercise per Session: 30 min  Stress: No Stress Concern Present (07/16/2021)   [employer] of Occupational Health - Occupational Stress Questionnaire    Feeling of Stress : Not at all  Social Connections: Moderately Isolated (07/16/2021)   Social Connection and Isolation Panel [NHANES]    Frequency of Communication with Friends and Family: More than three times a week    Frequency of Social Gatherings with Friends and Family: More than three times a week    Attends Religious Services: Never    Database administrator or Organizations: No    Attends Banker Meetings: Never    Marital Status: Married    Objective:  LMP  (LMP Unknown)      05/27/2022    8:44 AM 01/28/2022    7:30 AM 10/22/2021    7:55 AM  BP/Weight  Systolic BP 130 130 124  Diastolic BP 80 84 74  Wt. (Lbs) 220 221.2 210  BMI 36.61 kg/m2 36.81 kg/m2 34.95 kg/m2    Physical Exam  Diabetic Foot Exam - Simple   No data filed      Lab Results  Component Value Date   WBC 7.1 05/27/2022   HGB 13.2 05/27/2022   HCT 40.0 05/27/2022   PLT 243 05/27/2022   GLUCOSE 91 05/27/2022   CHOL 176 05/27/2022   TRIG 134 05/27/2022   HDL 50 05/27/2022   LDLCALC 102 (H) 05/27/2022   ALT 18 05/27/2022   AST 18 05/27/2022   NA 139 05/27/2022   K 5.1 05/27/2022   CL 101 05/27/2022   CREATININE 1.43 (H) 05/27/2022   BUN 19 05/27/2022   CO2 23 05/27/2022   TSH 5.080 (H) 05/27/2022   HGBA1C 6.0 (H) 05/27/2022  Assessment & Plan:     There are no diagnoses linked to this encounter.   No orders of the defined types were placed in this encounter.   No orders of the defined types were placed in this encounter.    Follow-up: No follow-ups on file.   I,Jacqua L Marsh,acting as a scribe for [employer], PA.,have documented all relevant documentation on the behalf of Langley Gauss, PA,as directed by  Langley Gauss, PA while in the presence of Langley Gauss, [place].   An After Visit Summary was printed and given to the patient.  Langley Gauss, [place] [facility] [phone]

## 2022-09-01 ENCOUNTER — Encounter: Payer: Self-pay | Admitting: Physician Assistant

## 2022-09-01 ENCOUNTER — Ambulatory Visit (INDEPENDENT_AMBULATORY_CARE_PROVIDER_SITE_OTHER): Payer: 59 | Admitting: Physician Assistant

## 2022-09-01 VITALS — BP 144/80 | HR 65 | Temp 97.0°F | Ht 65.0 in | Wt 226.6 lb

## 2022-09-01 DIAGNOSIS — Z23 Encounter for immunization: Secondary | ICD-10-CM | POA: Diagnosis not present

## 2022-09-01 DIAGNOSIS — E039 Hypothyroidism, unspecified: Secondary | ICD-10-CM

## 2022-09-01 DIAGNOSIS — G5793 Unspecified mononeuropathy of bilateral lower limbs: Secondary | ICD-10-CM

## 2022-09-01 DIAGNOSIS — R300 Dysuria: Secondary | ICD-10-CM | POA: Diagnosis not present

## 2022-09-01 DIAGNOSIS — R7303 Prediabetes: Secondary | ICD-10-CM

## 2022-09-01 DIAGNOSIS — E782 Mixed hyperlipidemia: Secondary | ICD-10-CM

## 2022-09-01 DIAGNOSIS — I129 Hypertensive chronic kidney disease with stage 1 through stage 4 chronic kidney disease, or unspecified chronic kidney disease: Secondary | ICD-10-CM | POA: Diagnosis not present

## 2022-09-01 DIAGNOSIS — N1831 Chronic kidney disease, stage 3a: Secondary | ICD-10-CM

## 2022-09-01 DIAGNOSIS — E6609 Other obesity due to excess calories: Secondary | ICD-10-CM

## 2022-09-01 DIAGNOSIS — K219 Gastro-esophageal reflux disease without esophagitis: Secondary | ICD-10-CM

## 2022-09-01 DIAGNOSIS — G4700 Insomnia, unspecified: Secondary | ICD-10-CM

## 2022-09-01 DIAGNOSIS — Z6834 Body mass index (BMI) 34.0-34.9, adult: Secondary | ICD-10-CM

## 2022-09-01 DIAGNOSIS — N3090 Cystitis, unspecified without hematuria: Secondary | ICD-10-CM

## 2022-09-01 LAB — POCT URINALYSIS DIP (CLINITEK)
Bilirubin, UA: NEGATIVE
Blood, UA: NEGATIVE
Glucose, UA: NEGATIVE mg/dL
Ketones, POC UA: NEGATIVE mg/dL
Nitrite, UA: NEGATIVE
POC PROTEIN,UA: NEGATIVE
Spec Grav, UA: 1.025 (ref 1.010–1.025)
Urobilinogen, UA: 0.2 E.U./dL
pH, UA: 6 (ref 5.0–8.0)

## 2022-09-01 MED ORDER — TRAZODONE HCL 150 MG PO TABS
150.0000 mg | ORAL_TABLET | Freq: Every day | ORAL | 1 refills | Status: DC
Start: 2022-09-01 — End: 2022-12-06

## 2022-09-01 MED ORDER — OMEPRAZOLE 40 MG PO CPDR
40.0000 mg | DELAYED_RELEASE_CAPSULE | Freq: Every day | ORAL | 3 refills | Status: DC
Start: 2022-09-01 — End: 2022-12-06

## 2022-09-01 MED ORDER — PRAVASTATIN SODIUM 80 MG PO TABS
ORAL_TABLET | ORAL | 3 refills | Status: DC
Start: 2022-09-01 — End: 2022-12-06

## 2022-09-01 MED ORDER — LOSARTAN POTASSIUM 100 MG PO TABS
100.0000 mg | ORAL_TABLET | Freq: Every day | ORAL | 3 refills | Status: DC
Start: 2022-09-01 — End: 2022-12-06

## 2022-09-01 MED ORDER — GABAPENTIN 100 MG PO CAPS
100.0000 mg | ORAL_CAPSULE | Freq: Two times a day (BID) | ORAL | 3 refills | Status: DC
Start: 2022-09-01 — End: 2022-10-13

## 2022-09-01 MED ORDER — AMLODIPINE BESYLATE 5 MG PO TABS
5.0000 mg | ORAL_TABLET | Freq: Every day | ORAL | 1 refills | Status: DC
Start: 2022-09-01 — End: 2022-12-06

## 2022-09-01 MED ORDER — LEVOTHYROXINE SODIUM 100 MCG PO TABS
100.0000 ug | ORAL_TABLET | Freq: Every day | ORAL | 3 refills | Status: DC
Start: 2022-09-01 — End: 2023-05-27

## 2022-09-01 MED ORDER — CONTRAVE 8-90 MG PO TB12
ORAL_TABLET | ORAL | 3 refills | Status: DC
Start: 2022-09-01 — End: 2022-10-25

## 2022-09-01 NOTE — Assessment & Plan Note (Signed)
Controlled Denies any major side effects or symptoms Continue taking Trazodone 150mg  as prescribed

## 2022-09-01 NOTE — Assessment & Plan Note (Addendum)
Currently Diet controlled Continue to work on diet and exercise

## 2022-09-01 NOTE — Assessment & Plan Note (Signed)
Controlled Denies any new or worsening symptoms Continue taking Omeprazole 40mg  as directed Will adjust dose as needed

## 2022-09-01 NOTE — Assessment & Plan Note (Signed)
Increased gabapentin to 200mg  Will continue to monitor kidney function and adjust medication if it falls below 30

## 2022-09-01 NOTE — Assessment & Plan Note (Signed)
Controlled Denies any major side effects or issues with medication Continue taking Pravastatin 80mg  as prescribed Labs drawn today Will adjust based on labs  Lab Results  Component Value Date   LDLCALC 102 (H) 05/27/2022

## 2022-09-01 NOTE — Assessment & Plan Note (Signed)
Uncontrolled Previous lab slightly elevated Will assess labs today and make medication adjustment as needed Continue taking Synthroid 

## 2022-09-01 NOTE — Assessment & Plan Note (Signed)
Prescribed contrave Will adjust dose as needed Sent to [facility]  Gave patient QR code to sign up for discount.

## 2022-09-01 NOTE — Assessment & Plan Note (Addendum)
Did not take medicine this morning Will take it after blood work today Will make adjustments as needed Continue taking Losartan 100mg , Amlodipine 5mg  BP Readings from Last 3 Encounters:  09/01/22 (!) 144/80  05/27/22 130/80  01/28/22 130/84

## 2022-09-02 LAB — CBC WITH DIFFERENTIAL/PLATELET
Basophils Absolute: 0.1 10*3/uL (ref 0.0–0.2)
Basos: 1 %
EOS (ABSOLUTE): 0.1 10*3/uL (ref 0.0–0.4)
Eos: 1 %
Hematocrit: 40.6 % (ref 34.0–46.6)
Hemoglobin: 13.3 g/dL (ref 11.1–15.9)
Immature Grans (Abs): 0 10*3/uL (ref 0.0–0.1)
Immature Granulocytes: 0 %
Lymphocytes Absolute: 2.8 10*3/uL (ref 0.7–3.1)
Lymphs: 38 %
MCH: 32 pg (ref 26.6–33.0)
MCHC: 32.8 g/dL (ref 31.5–35.7)
MCV: 98 fL — ABNORMAL HIGH (ref 79–97)
Monocytes Absolute: 0.5 10*3/uL (ref 0.1–0.9)
Monocytes: 8 %
Neutrophils Absolute: 3.7 10*3/uL (ref 1.4–7.0)
Neutrophils: 52 %
Platelets: 243 10*3/uL (ref 150–450)
RBC: 4.16 x10E6/uL (ref 3.77–5.28)
RDW: 12.8 % (ref 11.7–15.4)
WBC: 7.2 10*3/uL (ref 3.4–10.8)

## 2022-09-02 LAB — LIPID PANEL
Chol/HDL Ratio: 4.3 ratio (ref 0.0–4.4)
Cholesterol, Total: 207 mg/dL — ABNORMAL HIGH (ref 100–199)
HDL: 48 mg/dL (ref >39)
LDL Chol Calc (NIH): 115 mg/dL — ABNORMAL HIGH (ref 0–99)
Triglycerides: 255 mg/dL — ABNORMAL HIGH (ref 0–149)
VLDL Cholesterol Cal: 44 mg/dL — ABNORMAL HIGH (ref 5–40)

## 2022-09-02 LAB — CMP14+EGFR
ALT: 23 IU/L (ref 0–32)
AST: 22 IU/L (ref 0–40)
Albumin: 4.3 g/dL (ref 3.9–4.9)
Alkaline Phosphatase: 97 IU/L (ref 44–121)
BUN/Creatinine Ratio: 11 — ABNORMAL LOW (ref 12–28)
BUN: 14 mg/dL (ref 8–27)
Bilirubin Total: 0.4 mg/dL (ref 0.0–1.2)
CO2: 24 mmol/L (ref 20–29)
Calcium: 9.5 mg/dL (ref 8.7–10.3)
Chloride: 100 mmol/L (ref 96–106)
Creatinine, Ser: 1.27 mg/dL — ABNORMAL HIGH (ref 0.57–1.00)
Globulin, Total: 2.6 g/dL (ref 1.5–4.5)
Glucose: 86 mg/dL (ref 70–99)
Potassium: 4.8 mmol/L (ref 3.5–5.2)
Sodium: 140 mmol/L (ref 134–144)
Total Protein: 6.9 g/dL (ref 6.0–8.5)
eGFR: 48 mL/min/{1.73_m2} — ABNORMAL LOW (ref >59)

## 2022-09-02 LAB — HEMOGLOBIN A1C
Est. average glucose Bld gHb Est-mCnc: 117 mg/dL
Hgb A1c MFr Bld: 5.7 % — ABNORMAL HIGH (ref 4.8–5.6)

## 2022-09-02 LAB — T4, FREE: Free T4: 1.24 ng/dL (ref 0.82–1.77)

## 2022-09-02 LAB — URINE CULTURE

## 2022-09-02 LAB — LITHOLINK CKD PROGRAM

## 2022-09-02 LAB — TSH: TSH: 5.24 u[IU]/mL — ABNORMAL HIGH (ref 0.450–4.500)

## 2022-10-13 ENCOUNTER — Encounter: Payer: Self-pay | Admitting: Physician Assistant

## 2022-10-13 ENCOUNTER — Ambulatory Visit (INDEPENDENT_AMBULATORY_CARE_PROVIDER_SITE_OTHER): Payer: 59 | Admitting: Physician Assistant

## 2022-10-13 VITALS — BP 120/80 | HR 70 | Temp 97.1°F | Resp 14 | Ht 65.0 in | Wt 226.0 lb

## 2022-10-13 DIAGNOSIS — G5793 Unspecified mononeuropathy of bilateral lower limbs: Secondary | ICD-10-CM | POA: Diagnosis not present

## 2022-10-13 MED ORDER — PREGABALIN 50 MG PO CAPS
50.0000 mg | ORAL_CAPSULE | Freq: Two times a day (BID) | ORAL | 3 refills | Status: DC
Start: 2022-10-13 — End: 2022-12-06

## 2022-10-13 MED ORDER — PREGABALIN 50 MG PO CAPS
50.0000 mg | ORAL_CAPSULE | Freq: Two times a day (BID) | ORAL | 0 refills | Status: DC
Start: 2022-10-13 — End: 2022-12-06

## 2022-10-13 NOTE — Progress Notes (Signed)
Subjective:  Patient ID: Sandy Beck, female    DOB: 10/14/1960  Age: 62 y.o. 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 She is taking gabapentin and it did not help her. She would like to try another medication such as lyrica. States that her sister has been taking lyrica due to her history of stroke. She had her sister give her some and she took it for the past few days. She states she is doing considerably better with no pain in either foot and no swelling. States that she has been feeling a lot better and would like to stay on it. Denies any changes in urine or flank pain. Was taking two 50mg  tablets at night and it would last her throughout the next day. She gets her medicine through a mail order pharmacy so she would like to have a bridge dose to help her make it to when the prescription would come in.   Discussed Contrave and how it did not help her. She admits to having a slight decrease in appetite for the first few weeks, but then it stopped being as effective for her. Is interested in starting phentermine at her follow up appointment in November. Wants to do some research on it and make sure it is ok for her due to her having one kidney.       09/01/2022    8:46 AM 05/27/2022    8:57 AM 01/28/2022    7:32 AM 10/22/2021    7:58 AM 07/16/2021    9:59 AM  Depression screen PHQ 2/9  Decreased Interest 0 1 0 0 0  Down, Depressed, Hopeless 0 0 0 0 0  PHQ - 2 Score 0 1 0 0 0  Altered sleeping 1 1  0   Tired, decreased energy 3 2  2    Change in appetite 3 0  0   Feeling bad or failure about yourself  0 0  0   Trouble concentrating 0 0  0   Moving slowly or fidgety/restless 0 0  0   Suicidal thoughts 0 0  0   PHQ-9 Score 7 4  2    Difficult doing work/chores Somewhat difficult Not difficult at all  Not difficult at all         09/01/2022    8:46 AM  Fall Risk   Falls in the past year?  0  Number falls in past yr: 0  Injury with Fall? 0  Risk for fall due to : No Fall Risks  Follow up Falls evaluation completed    Patient Care Team: Langley Gauss, [place] as PCP - General (Physician Assistant) Coralyn Mark, MD (Urology)   Review of Systems  Constitutional:  Negative for chills, fatigue and fever.  HENT:  Negative for congestion, ear pain and sore throat.   Respiratory:  Negative for cough and shortness of breath.   Cardiovascular:  Negative for chest pain and palpitations.  Gastrointestinal:  Negative for abdominal pain, constipation, diarrhea, nausea and vomiting.  Endocrine: Negative for polydipsia, polyphagia and polyuria.  Genitourinary:  Negative for difficulty urinating and dysuria.  Musculoskeletal:  Negative for arthralgias, back pain and myalgias.  Skin:  Negative for rash.  Neurological:  Negative for headaches.  Psychiatric/Behavioral:  Negative for dysphoric mood. The patient is not nervous/anxious.     Current Outpatient Medications on File Prior to Visit  Medication  Sig Dispense Refill   amLODipine (NORVASC) 5 MG tablet Take 1 tablet (5 mg total) by mouth daily. 90 tablet 1   levothyroxine (SYNTHROID) 100 MCG tablet Take 1 tablet (100 mcg total) by mouth daily before breakfast. 90 tablet 3   losartan (COZAAR) 100 MG tablet Take 1 tablet (100 mg total) by mouth daily. 90 tablet 3   Naltrexone-buPROPion HCl ER (CONTRAVE) 8-90 MG TB12 Start 1 tablet every morning for 7 days, then 1 tablet twice daily for 7 days, then 2 tablets every morning and one every evening 120 tablet 3   omeprazole (PRILOSEC) 40 MG capsule Take 1 capsule (40 mg total) by mouth daily. 90 capsule 3   pravastatin (PRAVACHOL) 80 MG tablet TAKE 1 TABLET BY MOUTH DAILY 90 tablet 3   traZODone (DESYREL) 150 MG tablet Take 1 tablet (150 mg total) by mouth at bedtime. 90 tablet 1   No current facility-administered medications on file prior to visit.   Past Medical History:  Diagnosis Date    Allergy    Arthritis    Cancer (HCC) 2022   left renal cancer, had left nephrectomy   GERD (gastroesophageal reflux disease)    Heart murmur    Hyperlipidemia    Hypertension    Sleep apnea    not currently using cpap   Thyroid disease    Past Surgical History:  Procedure Laterality Date   LAPAROTOMY  1980s   tubal pregnancy, left fallopian tube removal   NEPHRECTOMY Left     Family History  Problem Relation Age of Onset   Alzheimer's disease Mother    Diabetes Mellitus II Mother    Hypertension Father    Stroke Sister    Hypertension Sister    Breast cancer Neg Hx    Social History   Socioeconomic History   Marital status: Married    Spouse name: Noor Branigan   Number of children: 1   Years of education: Not on file   Highest education level: Not on file  Occupational History   Occupation: Conservation officer, nature   Occupation: Retired  Tobacco Use   Smoking status: Every Day    Current packs/day: 0.25    Average packs/day: 0.3 packs/day for 45.0 years (11.3 ttl pk-yrs)    Types: Cigarettes   Smokeless tobacco: Never  Vaping Use   Vaping status: Some Days  Substance and Sexual Activity   Alcohol use: Not Currently   Drug use: Never   Sexual activity: Yes    Partners: Male  Other Topics Concern   Not on file  Social History Narrative   Not on file   Social Determinants of Health   Financial Resource Strain: Low Risk  (07/16/2021)   Overall Financial Resource Strain (CARDIA)    Difficulty of Paying Living Expenses: Not hard at all  Food Insecurity: No Food Insecurity (07/16/2021)   Hunger Vital Sign    Worried About Running Out of Food in the Last Year: Never true    Ran Out of Food in the Last Year: Never true  Transportation Needs: No Transportation Needs (07/16/2021)   PRAPARE - Administrator, Civil Service (Medical): No    Lack of Transportation (Non-Medical): No  Physical Activity: Sufficiently Active (07/16/2021)   Exercise Vital Sign    Days of  Exercise per Week: 5 days    Minutes of Exercise per Session: 30 min  Stress: No Stress Concern Present (07/16/2021)   [employer] of Occupational Health - Occupational Stress Questionnaire  Feeling of Stress : Not at all  Social Connections: Moderately Isolated (07/16/2021)   Social Connection and Isolation Panel [NHANES]    Frequency of Communication with Friends and Family: More than three times a week    Frequency of Social Gatherings with Friends and Family: More than three times a week    Attends Religious Services: Never    Database administrator or Organizations: No    Attends Engineer, structural: Never    Marital Status: Married    Objective:  BP 120/80   Pulse 70   Temp (!) 97.1 F (36.2 C)   Resp 14   Ht 5\' 5"  (1.651 m)   Wt 226 lb (102.5 kg)   LMP  (LMP Unknown)   SpO2 93%   BMI 37.61 kg/m      10/13/2022    9:55 AM 09/01/2022    8:38 AM 05/27/2022    8:44 AM  BP/Weight  Systolic BP 120 144 130  Diastolic BP 80 80 80  Wt. (Lbs) 226 226.6 220  BMI 37.61 kg/m2 37.71 kg/m2 36.61 kg/m2    Physical Exam Vitals reviewed.  Constitutional:      Appearance: Normal appearance.  Cardiovascular:     Rate and Rhythm: Normal rate and regular rhythm.     Heart sounds: Normal heart sounds.  Pulmonary:     Effort: Pulmonary effort is normal.     Breath sounds: Normal breath sounds.  Abdominal:     General: Bowel sounds are normal.     Palpations: Abdomen is soft.     Tenderness: There is no abdominal tenderness.  Neurological:     Mental Status: She is alert and oriented to person, place, and time.  Psychiatric:        Mood and Affect: Mood normal.        Behavior: Behavior normal.     Diabetic Foot Exam - Simple   No data filed      Lab Results  Component Value Date   WBC 7.2 09/01/2022   HGB 13.3 09/01/2022   HCT 40.6 09/01/2022   PLT 243 09/01/2022   GLUCOSE 86 09/01/2022   CHOL 207 (H) 09/01/2022   TRIG 255 (H) 09/01/2022   HDL 48  09/01/2022   LDLCALC 115 (H) 09/01/2022   ALT 23 09/01/2022   AST 22 09/01/2022   NA 140 09/01/2022   K 4.8 09/01/2022   CL 100 09/01/2022   CREATININE 1.27 (H) 09/01/2022   BUN 14 09/01/2022   CO2 24 09/01/2022   TSH 5.240 (H) 09/01/2022   HGBA1C 5.7 (H) 09/01/2022      Assessment & Plan:    Neuropathy of both feet Assessment & Plan: Prescribed Lyrica 50mg  Will continue to monitor symptoms for kidney function abnormalities Will draw labs at next chronic visit in November   Orders: -     Pregabalin; Take 1 capsule (50 mg total) by mouth 2 (two) times daily.  Dispense: 60 capsule; Refill: 3 -     Pregabalin; Take 1 capsule (50 mg total) by mouth 2 (two) times daily.  Dispense: 14 capsule; Refill: 0     Meds ordered this encounter  Medications   pregabalin (LYRICA) 50 MG capsule    Sig: Take 1 capsule (50 mg total) by mouth 2 (two) times daily.    Dispense:  60 capsule    Refill:  3   pregabalin (LYRICA) 50 MG capsule    Sig: Take 1 capsule (50 mg total)  by mouth 2 (two) times daily.    Dispense:  14 capsule    Refill:  0    No orders of the defined types were placed in this encounter.    Follow-up: Return if symptoms worsen or fail to improve.   I,Marla I Leal-Borjas,acting as a scribe for [employer], PA.,have documented all relevant documentation on the behalf of Langley Gauss, PA,as directed by  Langley Gauss, PA while in the presence of Langley Gauss, [place].   An After Visit Summary was printed and given to the patient.  Langley Gauss, [place] [facility] [phone]

## 2022-10-13 NOTE — Assessment & Plan Note (Signed)
Prescribed Lyrica 50mg  Will continue to monitor symptoms for kidney function abnormalities Will draw labs at next chronic visit in November

## 2022-10-21 ENCOUNTER — Other Ambulatory Visit: Payer: Self-pay

## 2022-10-25 ENCOUNTER — Other Ambulatory Visit: Payer: Self-pay | Admitting: Physician Assistant

## 2022-10-25 ENCOUNTER — Telehealth: Payer: Self-pay

## 2022-10-25 DIAGNOSIS — E66811 Obesity, class 1: Secondary | ICD-10-CM

## 2022-10-25 NOTE — Telephone Encounter (Signed)
Patient would like for you to send Adipex, stated she has took this medication in the pass.

## 2022-10-26 MED ORDER — PHENTERMINE HCL 37.5 MG PO TABS
37.5000 mg | ORAL_TABLET | Freq: Every day | ORAL | 0 refills | Status: DC
Start: 2022-10-26 — End: 2023-03-14

## 2022-11-03 HISTORY — PX: CHOLECYSTECTOMY, LAPAROSCOPIC: SHX56

## 2022-12-06 ENCOUNTER — Ambulatory Visit (INDEPENDENT_AMBULATORY_CARE_PROVIDER_SITE_OTHER): Payer: 59 | Admitting: Physician Assistant

## 2022-12-06 ENCOUNTER — Encounter: Payer: Self-pay | Admitting: Physician Assistant

## 2022-12-06 VITALS — BP 120/70 | HR 72 | Temp 97.4°F | Resp 14 | Ht 65.0 in | Wt 220.0 lb

## 2022-12-06 DIAGNOSIS — I129 Hypertensive chronic kidney disease with stage 1 through stage 4 chronic kidney disease, or unspecified chronic kidney disease: Secondary | ICD-10-CM

## 2022-12-06 DIAGNOSIS — E039 Hypothyroidism, unspecified: Secondary | ICD-10-CM | POA: Diagnosis not present

## 2022-12-06 DIAGNOSIS — R300 Dysuria: Secondary | ICD-10-CM | POA: Insufficient documentation

## 2022-12-06 DIAGNOSIS — K219 Gastro-esophageal reflux disease without esophagitis: Secondary | ICD-10-CM | POA: Diagnosis not present

## 2022-12-06 DIAGNOSIS — Z9049 Acquired absence of other specified parts of digestive tract: Secondary | ICD-10-CM | POA: Insufficient documentation

## 2022-12-06 DIAGNOSIS — E782 Mixed hyperlipidemia: Secondary | ICD-10-CM

## 2022-12-06 DIAGNOSIS — Z1211 Encounter for screening for malignant neoplasm of colon: Secondary | ICD-10-CM | POA: Insufficient documentation

## 2022-12-06 DIAGNOSIS — N1831 Chronic kidney disease, stage 3a: Secondary | ICD-10-CM

## 2022-12-06 DIAGNOSIS — G4700 Insomnia, unspecified: Secondary | ICD-10-CM

## 2022-12-06 DIAGNOSIS — G5793 Unspecified mononeuropathy of bilateral lower limbs: Secondary | ICD-10-CM

## 2022-12-06 DIAGNOSIS — E66811 Obesity, class 1: Secondary | ICD-10-CM

## 2022-12-06 DIAGNOSIS — Z1212 Encounter for screening for malignant neoplasm of rectum: Secondary | ICD-10-CM | POA: Insufficient documentation

## 2022-12-06 DIAGNOSIS — E6609 Other obesity due to excess calories: Secondary | ICD-10-CM

## 2022-12-06 LAB — POCT URINALYSIS DIP (CLINITEK)
Bilirubin, UA: NEGATIVE
Blood, UA: NEGATIVE
Glucose, UA: NEGATIVE mg/dL
Ketones, POC UA: NEGATIVE mg/dL
Nitrite, UA: NEGATIVE
Spec Grav, UA: 1.025 (ref 1.010–1.025)
Urobilinogen, UA: 0.2 U/dL
pH, UA: 5.5 (ref 5.0–8.0)

## 2022-12-06 MED ORDER — TRAZODONE HCL 150 MG PO TABS
150.0000 mg | ORAL_TABLET | Freq: Every day | ORAL | 1 refills | Status: DC
Start: 2022-12-06 — End: 2023-08-31

## 2022-12-06 MED ORDER — OMEPRAZOLE 40 MG PO CPDR
40.0000 mg | DELAYED_RELEASE_CAPSULE | Freq: Every day | ORAL | 3 refills | Status: DC
Start: 2022-12-06 — End: 2023-05-27

## 2022-12-06 MED ORDER — PREGABALIN 50 MG PO CAPS
50.0000 mg | ORAL_CAPSULE | Freq: Two times a day (BID) | ORAL | 3 refills | Status: DC
Start: 2022-12-06 — End: 2023-03-14

## 2022-12-06 MED ORDER — AMOXICILLIN-POT CLAVULANATE 875-125 MG PO TABS: 1.0000 | ORAL_TABLET | Freq: Two times a day (BID) | ORAL | 0 refills | Status: DC

## 2022-12-06 MED ORDER — LOSARTAN POTASSIUM 100 MG PO TABS
100.0000 mg | ORAL_TABLET | Freq: Every day | ORAL | 3 refills | Status: DC
Start: 2022-12-06 — End: 2023-05-27

## 2022-12-06 MED ORDER — PRAVASTATIN SODIUM 80 MG PO TABS
ORAL_TABLET | ORAL | 3 refills | Status: DC
Start: 2022-12-06 — End: 2023-03-14

## 2022-12-06 MED ORDER — FLUCONAZOLE 150 MG PO TABS: 150.0000 mg | ORAL_TABLET | Freq: Once | ORAL | 1 refills | Status: DC

## 2022-12-06 MED ORDER — AMLODIPINE BESYLATE 5 MG PO TABS: 5.0000 mg | ORAL_TABLET | Freq: Every day | ORAL | 1 refills | Status: DC

## 2022-12-06 NOTE — Assessment & Plan Note (Signed)
Controlled Denies any issues or complaints Continue taking omeprazole 40mg  Will adjust treatment depending on symptoms

## 2022-12-06 NOTE — Assessment & Plan Note (Signed)
Labs drawn today Continue taking Synthroid Will adjust treatment based on labs

## 2022-12-06 NOTE — Assessment & Plan Note (Signed)
Recovery from recent gallbladder surgery with resolution of abdominal pain. -Continue current management.

## 2022-12-06 NOTE — Assessment & Plan Note (Signed)
Patient has not had recent colon cancer screening. -Order Cologuard test for colon cancer screening.

## 2022-12-06 NOTE — Progress Notes (Signed)
Subjective:  Patient ID: Sandy Beck, female    DOB: 07/08/1960  Age: 62 y.o. MRN: [id number]  Chief Complaint  Patient presents with   Medical Management of Chronic Issues    HPI    Pt presents for follow-up of chronic medical problems of hypertension, prediabetes, and hyperlipidemia.   Hypothyroidism: levothyroxine 100 mg daily.  Hyperlipidemia: Current medications:Pravastatin 80 mg   Hypertension: Complications: hyperlipidemia  Current medications:Amlodipine 5 mg daily and losartan 100 mg daily.   Insomnia: Trazodone 150 mg at bedtime  Chronic Foot pain: Lyrica 50 mg twice a day  Diet: watching what she eat, Patient is concerned about weight gain and was wanting to try a medication to help with weight loss. She has adipex at home. She has not started yet because she had a surgery.   Exercise: No Exercise  Discussed the use of AI scribe software for clinical note transcription with the patient, who gave verbal consent to proceed.  History of Present Illness   The patient, a 62 year old female with a history of kidney cancer and recent cholecystectomy, presents for a routine follow-up. She reports a challenging past month, marked by the loss of two uncles and subsequent funeral planning, as well as her own health issues. She experienced severe abdominal pain, which led to the discovery of gallstones and subsequent cholecystectomy. The surgeon reported it as one of the worst cases she had seen. Since the surgery, the patient reports that her stomach pain has resolved and her digestive system has returned to normal.  The patient also reports significant stress due to caregiving responsibilities for her father, who has congestive heart failure and Alzheimer's, and her sister, who suffered a stroke eight years ago and has been progressively declining. She expresses frustration with the lack of support from home health services and the emotional toll of managing her relatives'  health issues, cooking, and personal care.  The patient has not yet started the prescribed Adipex due to the recent health events but plans to begin soon. She reports an unintentional weight loss of eight pounds, which she attributes to decreased appetite following the surgery. She also reports symptoms suggestive of a urinary tract infection, including discomfort and changes in urinary habits. She has a history of urinary tract infections and has previously been treated with Augmentin.  The patient also mentions a recent ultrasound that checked her remaining kidney and found it to be in good condition. She expresses concern about her upcoming 62th birthday and the stress of the upcoming wedding of a family member. Despite these stressors, she reports managing her anxiety well.          09/01/2022    8:46 AM 05/27/2022    8:57 AM 01/28/2022    7:32 AM 10/22/2021    7:58 AM 07/16/2021    9:59 AM  Depression screen PHQ 2/9  Decreased Interest 0 1 0 0 0  Down, Depressed, Hopeless 0 0 0 0 0  PHQ - 2 Score 0 1 0 0 0  Altered sleeping 1 1  0   Tired, decreased energy 3 2  2    Change in appetite 3 0  0   Feeling bad or failure about yourself  0 0  0   Trouble concentrating 0 0  0   Moving slowly or fidgety/restless 0 0  0   Suicidal thoughts 0 0  0   PHQ-9 Score 7 4  2    Difficult doing work/chores Somewhat difficult Not difficult at all  Not difficult at all         09/01/2022    8:46 AM  Fall Risk   Falls in the past year? 0  Number falls in past yr: 0  Injury with Fall? 0  Risk for fall due to : No Fall Risks  Follow up Falls evaluation completed    Patient Care Team: Langley Gauss, [place] as PCP - General (Physician Assistant) Coralyn Mark, MD (Urology)   Review of Systems  Constitutional:  Negative for chills, fatigue and fever.  HENT:  Negative for congestion, ear pain and sore throat.   Respiratory:  Negative for cough and shortness of breath.   Cardiovascular:  Negative for  chest pain and palpitations.  Gastrointestinal:  Negative for abdominal pain (ruq abdominal pain), constipation, diarrhea, nausea and vomiting.  Endocrine: Negative for polydipsia, polyphagia and polyuria.  Genitourinary:  Negative for difficulty urinating and dysuria.  Musculoskeletal:  Negative for arthralgias, back pain and myalgias.  Skin:  Negative for rash.  Neurological:  Negative for headaches.  Psychiatric/Behavioral:  Negative for dysphoric mood. The patient is not nervous/anxious.     Current Outpatient Medications on File Prior to Visit  Medication Sig Dispense Refill   levothyroxine (SYNTHROID) 100 MCG tablet Take 1 tablet (100 mcg total) by mouth daily before breakfast. 90 tablet 3   phentermine (ADIPEX-P) 37.5 MG tablet Take 1 tablet (37.5 mg total) by mouth daily before breakfast. 30 tablet 0   No current facility-administered medications on file prior to visit.   Past Medical History:  Diagnosis Date   Allergy    Arthritis    Cancer (HCC) 2022   left renal cancer, had left nephrectomy   GERD (gastroesophageal reflux disease)    Heart murmur    Hyperlipidemia    Hypertension    Sleep apnea    not currently using cpap   Thyroid disease    Past Surgical History:  Procedure Laterality Date   CHOLECYSTECTOMY, LAPAROSCOPIC  11/03/2022   LAPAROTOMY  1980s   tubal pregnancy, left fallopian tube removal   NEPHRECTOMY Left     Family History  Problem Relation Age of Onset   Alzheimer's disease Mother    Diabetes Mellitus II Mother    Hypertension Father    Stroke Sister    Hypertension Sister    Breast cancer Neg Hx    Social History   Socioeconomic History   Marital status: Married    Spouse name: Hikari Dieker   Number of children: 1   Years of education: Not on file   Highest education level: Not on file  Occupational History   Occupation: Conservation officer, nature   Occupation: Retired  Tobacco Use   Smoking status: Every Day    Current packs/day: 0.25    Average  packs/day: 0.3 packs/day for 45.0 years (11.3 ttl pk-yrs)    Types: Cigarettes   Smokeless tobacco: Never  Vaping Use   Vaping status: Some Days  Substance and Sexual Activity   Alcohol use: Not Currently   Drug use: Never   Sexual activity: Yes    Partners: Male  Other Topics Concern   Not on file  Social History Narrative   Not on file   Social Determinants of Health   Financial Resource Strain: Low Risk  (07/16/2021)   Overall Financial Resource Strain (CARDIA)    Difficulty of Paying Living Expenses: Not hard at all  Food Insecurity: No Food Insecurity (07/16/2021)   Hunger Vital Sign    Worried About  Running Out of Food in the Last Year: Never true    Ran Out of Food in the Last Year: Never true  Transportation Needs: No Transportation Needs (07/16/2021)   PRAPARE - Administrator, Civil Service (Medical): No    Lack of Transportation (Non-Medical): No  Physical Activity: Sufficiently Active (07/16/2021)   Exercise Vital Sign    Days of Exercise per Week: 5 days    Minutes of Exercise per Session: 30 min  Stress: No Stress Concern Present (07/16/2021)   [employer] of Occupational Health - Occupational Stress Questionnaire    Feeling of Stress : Not at all  Social Connections: Moderately Isolated (07/16/2021)   Social Connection and Isolation Panel [NHANES]    Frequency of Communication with Friends and Family: More than three times a week    Frequency of Social Gatherings with Friends and Family: More than three times a week    Attends Religious Services: Never    Database administrator or Organizations: No    Attends Engineer, structural: Never    Marital Status: Married    Objective:  BP 120/70   Pulse 72   Temp (!) 97.4 F (36.3 C)   Resp 14   Ht 5\' 5"  (1.651 m)   Wt 220 lb (99.8 kg)   LMP  (LMP Unknown)   SpO2 97%   BMI 36.61 kg/m      12/06/2022    8:32 AM 10/13/2022    9:55 AM 09/01/2022    8:38 AM  BP/Weight  Systolic BP  120 595 144  Diastolic BP 70 80 80  Wt. (Lbs) 220 226 226.6  BMI 36.61 kg/m2 37.61 kg/m2 37.71 kg/m2    Physical Exam Vitals reviewed.  Constitutional:      Appearance: Normal appearance.  Cardiovascular:     Rate and Rhythm: Normal rate and regular rhythm.     Heart sounds: Normal heart sounds.  Pulmonary:     Effort: Pulmonary effort is normal.     Breath sounds: Normal breath sounds.  Abdominal:     General: Bowel sounds are normal.     Palpations: Abdomen is soft.     Tenderness: There is no abdominal tenderness.  Neurological:     Mental Status: She is alert and oriented to person, place, and time.  Psychiatric:        Mood and Affect: Mood normal.        Behavior: Behavior normal.     Diabetic Foot Exam - Simple   No data filed      Lab Results  Component Value Date   WBC 7.2 09/01/2022   HGB 13.3 09/01/2022   HCT 40.6 09/01/2022   PLT 243 09/01/2022   GLUCOSE 86 09/01/2022   CHOL 207 (H) 09/01/2022   TRIG 255 (H) 09/01/2022   HDL 48 09/01/2022   LDLCALC 115 (H) 09/01/2022   ALT 23 09/01/2022   AST 22 09/01/2022   NA 140 09/01/2022   K 4.8 09/01/2022   CL 100 09/01/2022   CREATININE 1.27 (H) 09/01/2022   BUN 14 09/01/2022   CO2 24 09/01/2022   TSH 5.240 (H) 09/01/2022   HGBA1C 5.7 (H) 09/01/2022      Assessment & Plan:    Acquired hypothyroidism Assessment & Plan: Labs drawn today Continue taking Synthroid Will adjust treatment based on labs  Orders: -     TSH  Benign hypertension with stage 3a chronic kidney disease (HCC) Assessment & Plan:  Well controlled.  Continue to work on eating a healthy diet and exercise.  Labs drawn today.   No major side effects reported, and no issues with compliance. The current medical regimen is effective;  continue present plan with Amlodipine 5mg , Losartan 100mg  Will adjust medication as needed depending on labs BP Readings from Last 3 Encounters:  12/06/22 120/70  10/13/22 120/80  09/01/22  (!) 144/80     Orders: -     amLODIPine Besylate; Take 1 tablet (5 mg total) by mouth daily.  Dispense: 90 tablet; Refill: 1 -     Losartan Potassium; Take 1 tablet (100 mg total) by mouth daily.  Dispense: 90 tablet; Refill: 3 -     CBC with Differential/Platelet -     Comprehensive metabolic panel  Gastroesophageal reflux disease, unspecified whether esophagitis present Assessment & Plan: Controlled Denies any issues or complaints Continue taking omeprazole 40mg  Will adjust treatment depending on symptoms  Orders: -     Omeprazole; Take 1 capsule (40 mg total) by mouth daily.  Dispense: 90 capsule; Refill: 3 -     VITAMIN D 25 Hydroxy (Vit-D Deficiency, Fractures)  Mixed hyperlipidemia -     Pravastatin Sodium; TAKE 1 TABLET BY MOUTH DAILY  Dispense: 90 tablet; Refill: 3 -     Lipid panel  Insomnia, unspecified type -     traZODone HCl; Take 1 tablet (150 mg total) by mouth at bedtime.  Dispense: 90 tablet; Refill: 1  Neuropathy of both feet Assessment & Plan: Improved since starting the medicine Continue to monitor symptoms Continue taking Lyrica 50mg    Orders: -     Pregabalin; Take 1 capsule (50 mg total) by mouth 2 (two) times daily.  Dispense: 60 capsule; Refill: 3  Dysuria Assessment & Plan: Patient reports symptoms suggestive of UTI. -Start Augmentin. -Send urine for culture. -If culture shows resistance to Augmentin, change antibiotic as needed.  Orders: -     POCT URINALYSIS DIP (CLINITEK) -     Urine Culture -     Amoxicillin-Pot Clavulanate; Take 1 tablet by mouth 2 (two) times daily.  Dispense: 20 tablet; Refill: 0  Screening for colorectal cancer Assessment & Plan: Patient has not had recent colon cancer screening. -Order Cologuard test for colon cancer screening.  Orders: -     Cologuard  Status post cholecystectomy Assessment & Plan: Recovery from recent gallbladder surgery with resolution of abdominal pain. -Continue current  management.   Class 1 obesity due to excess calories with serious comorbidity and body mass index (BMI) of 34.0 to 34.9 in adult Assessment & Plan: Patient has lost weight due to recent illness and surgery. Has a prescription for Adipex but has not started it yet. -Start Adipex as tolerated. -Check weight at next visit.   Other orders -     Fluconazole; Take 1 tablet (150 mg total) by mouth once for 1 dose.  Dispense: 1 tablet; Refill: 1   Assessment and Plan          Meds ordered this encounter  Medications   amLODipine (NORVASC) 5 MG tablet    Sig: Take 1 tablet (5 mg total) by mouth daily.    Dispense:  90 tablet    Refill:  1   losartan (COZAAR) 100 MG tablet    Sig: Take 1 tablet (100 mg total) by mouth daily.    Dispense:  90 tablet    Refill:  3    Please send a replace/new response with 90-Day Supply  if appropriate to maximize member benefit. Requesting 1 year supply.   omeprazole (PRILOSEC) 40 MG capsule    Sig: Take 1 capsule (40 mg total) by mouth daily.    Dispense:  90 capsule    Refill:  3    Requesting 1 year supply   pravastatin (PRAVACHOL) 80 MG tablet    Sig: TAKE 1 TABLET BY MOUTH DAILY    Dispense:  90 tablet    Refill:  3    Please send a replace/new response with 90-Day Supply if appropriate to maximize member benefit. Requesting 1 year supply.   traZODone (DESYREL) 150 MG tablet    Sig: Take 1 tablet (150 mg total) by mouth at bedtime.    Dispense:  90 tablet    Refill:  1   pregabalin (LYRICA) 50 MG capsule    Sig: Take 1 capsule (50 mg total) by mouth 2 (two) times daily.    Dispense:  60 capsule    Refill:  3   amoxicillin-clavulanate (AUGMENTIN) 875-125 MG tablet    Sig: Take 1 tablet by mouth 2 (two) times daily.    Dispense:  20 tablet    Refill:  0   fluconazole (DIFLUCAN) 150 MG tablet    Sig: Take 1 tablet (150 mg total) by mouth once for 1 dose.    Dispense:  1 tablet    Refill:  1    Orders Placed This Encounter  Procedures    Urine Culture   CBC with Differential/Platelet   Comprehensive metabolic panel   Lipid panel   TSH   VITAMIN D 25 Hydroxy (Vit-D Deficiency, Fractures)   Cologuard   POCT URINALYSIS DIP (CLINITEK)     Follow-up: Return in about 3 months (around 03/08/2023) for Chronic, Huston Foley.   I,Marla I Leal-Borjas,acting as a scribe for [employer], PA.,have documented all relevant documentation on the behalf of Langley Gauss, PA,as directed by  Langley Gauss, PA while in the presence of Langley Gauss, [place].   An After Visit Summary was printed and given to the patient.  Langley Gauss, [place] [facility] [phone]

## 2022-12-06 NOTE — Assessment & Plan Note (Signed)
Improved since starting the medicine Continue to monitor symptoms Continue taking Lyrica 50mg 

## 2022-12-06 NOTE — Assessment & Plan Note (Signed)
Well controlled.  Continue to work on eating a healthy diet and exercise.  Labs drawn today.   No major side effects reported, and no issues with compliance. The current medical regimen is effective;  continue present plan with Amlodipine 5mg , Losartan 100mg  Will adjust medication as needed depending on labs BP Readings from Last 3 Encounters:  12/06/22 120/70  10/13/22 120/80  09/01/22 (!) 144/80

## 2022-12-06 NOTE — Assessment & Plan Note (Signed)
Patient reports symptoms suggestive of UTI. -Start Augmentin. -Send urine for culture. -If culture shows resistance to Augmentin, change antibiotic as needed.

## 2022-12-06 NOTE — Assessment & Plan Note (Signed)
Patient has lost weight due to recent illness and surgery. Has a prescription for Adipex but has not started it yet. -Start Adipex as tolerated. -Check weight at next visit.

## 2022-12-07 LAB — COMPREHENSIVE METABOLIC PANEL
ALT: 17 [IU]/L (ref 0–32)
AST: 24 [IU]/L (ref 0–40)
Albumin: 4.4 g/dL (ref 3.9–4.9)
Alkaline Phosphatase: 104 [IU]/L (ref 44–121)
BUN/Creatinine Ratio: 11 — ABNORMAL LOW (ref 12–28)
BUN: 15 mg/dL (ref 8–27)
Bilirubin Total: 0.6 mg/dL (ref 0.0–1.2)
CO2: 24 mmol/L (ref 20–29)
Calcium: 9.6 mg/dL (ref 8.7–10.3)
Chloride: 101 mmol/L (ref 96–106)
Creatinine, Ser: 1.41 mg/dL — ABNORMAL HIGH (ref 0.57–1.00)
Globulin, Total: 2.7 g/dL (ref 1.5–4.5)
Glucose: 92 mg/dL (ref 70–99)
Potassium: 4.9 mmol/L (ref 3.5–5.2)
Sodium: 139 mmol/L (ref 134–144)
Total Protein: 7.1 g/dL (ref 6.0–8.5)
eGFR: 42 mL/min/{1.73_m2} — ABNORMAL LOW (ref >59)

## 2022-12-07 LAB — LIPID PANEL
Chol/HDL Ratio: 3.7 ratio (ref 0.0–4.4)
Cholesterol, Total: 181 mg/dL (ref 100–199)
HDL: 49 mg/dL (ref >39)
LDL Chol Calc (NIH): 103 mg/dL — ABNORMAL HIGH (ref 0–99)
Triglycerides: 165 mg/dL — ABNORMAL HIGH (ref 0–149)
VLDL Cholesterol Cal: 29 mg/dL (ref 5–40)

## 2022-12-07 LAB — CBC WITH DIFFERENTIAL/PLATELET
Basophils Absolute: 0 10*3/uL (ref 0.0–0.2)
Basos: 1 %
EOS (ABSOLUTE): 0.1 10*3/uL (ref 0.0–0.4)
Eos: 1 %
Hematocrit: 42.4 % (ref 34.0–46.6)
Hemoglobin: 13.3 g/dL (ref 11.1–15.9)
Immature Grans (Abs): 0 10*3/uL (ref 0.0–0.1)
Immature Granulocytes: 0 %
Lymphocytes Absolute: 2.5 10*3/uL (ref 0.7–3.1)
Lymphs: 36 %
MCH: 30.2 pg (ref 26.6–33.0)
MCHC: 31.4 g/dL — ABNORMAL LOW (ref 31.5–35.7)
MCV: 96 fL (ref 79–97)
Monocytes Absolute: 0.5 10*3/uL (ref 0.1–0.9)
Monocytes: 7 %
Neutrophils Absolute: 3.9 10*3/uL (ref 1.4–7.0)
Neutrophils: 55 %
Platelets: 265 10*3/uL (ref 150–450)
RBC: 4.41 x10E6/uL (ref 3.77–5.28)
RDW: 12.6 % (ref 11.7–15.4)
WBC: 7 10*3/uL (ref 3.4–10.8)

## 2022-12-07 LAB — VITAMIN D 25 HYDROXY (VIT D DEFICIENCY, FRACTURES): Vit D, 25-Hydroxy: 39 ng/mL (ref 30.0–100.0)

## 2022-12-07 LAB — TSH: TSH: 2.13 u[IU]/mL (ref 0.450–4.500)

## 2022-12-08 LAB — URINE CULTURE

## 2022-12-30 ENCOUNTER — Other Ambulatory Visit: Payer: Self-pay

## 2022-12-30 MED ORDER — FLUCONAZOLE 150 MG PO TABS: 150.0000 mg | ORAL_TABLET | Freq: Every day | ORAL | 0 refills | Status: DC

## 2023-03-10 ENCOUNTER — Ambulatory Visit: Payer: 59 | Admitting: Physician Assistant

## 2023-03-13 NOTE — Progress Notes (Unsigned)
 Subjective:  Patient ID: Sandy Beck, female    DOB: 06/20/1960  Age: 63 y.o. MRN: [id number]  Chief Complaint  Patient presents with   Medical Management of Chronic Issues    HPI    Pt presents for follow-up of chronic medical problems of hypertension, prediabetes, and hyperlipidemia.   Hypothyroidism: levothyroxine 100 mg daily.  Hyperlipidemia: Current medications:Pravastatin 80 mg   Hypertension: Complications: hyperlipidemia  Current medications:Amlodipine 5 mg daily and losartan 100 mg daily.   Insomnia: Trazodone 150 mg at bedtime  Chronic Foot pain: Patient stopped taking Lyrica due to it making her urine smell as if it was infected.  Feet has been swelling and is concerned with how much they keep swelling by the end of the day.   Discussed the use of AI scribe software for clinical note transcription with the patient, who gave verbal consent to proceed.  History of Present Illness   The patient, with a history of kidney disease and neuropathy, presents with worsening foot swelling and neuropathic pain. The swelling has been ongoing for the past few months and is described as 'like a balloon right there on top.' She denies dyspnea and reports propping her feet up due to discomfort. The neuropathic pain, described as burning, is severe enough to bring her to tears and occurs every two days. She reports that the pain is so severe that she would 'rather have a backache.' She was previously on Lyrica for the neuropathy, which was effective, but had to discontinue due to kidney side effects. She denies any other changes in medication.  The patient has also been dealing with significant personal stress, with the recent passing of her father and several other family members. She reports that she is trying to manage her stress and grief while also dealing with the practical aspects of her father's estate. She also mentions that her daughter has been having back pain and was  recently in the emergency room due to the pain.         03/14/2023    9:03 AM 09/01/2022    8:46 AM 05/27/2022    8:57 AM 01/28/2022    7:32 AM 10/22/2021    7:58 AM  Depression screen PHQ 2/9  Decreased Interest 1 0 1 0 0  Down, Depressed, Hopeless 0 0 0 0 0  PHQ - 2 Score 1 0 1 0 0  Altered sleeping 1 1 1   0  Tired, decreased energy 1 3 2  2   Change in appetite 1 3 0  0  Feeling bad or failure about yourself  0 0 0  0  Trouble concentrating 0 0 0  0  Moving slowly or fidgety/restless 0 0 0  0  Suicidal thoughts 0 0 0  0  PHQ-9 Score 4 7 4  2   Difficult doing work/chores Not difficult at all Somewhat difficult Not difficult at all  Not difficult at all        03/14/2023    9:03 AM  Fall Risk   Falls in the past year? 0  Number falls in past yr: 0  Injury with Fall? 0  Risk for fall due to : No Fall Risks  Follow up Falls evaluation completed    Patient Care Team: Langley Gauss, [place] as PCP - General (Physician Assistant) Coralyn Mark, MD (Urology)   Review of Systems  Constitutional:  Negative for appetite change, fatigue and fever.  HENT:  Negative for congestion, ear pain, sinus pressure and  sore throat.   Respiratory:  Negative for cough, chest tightness, shortness of breath and wheezing.   Cardiovascular:  Positive for leg swelling (Bilateral feet swelling on tops). Negative for chest pain and palpitations.  Gastrointestinal:  Negative for abdominal pain, constipation, diarrhea, nausea and vomiting.  Genitourinary:  Negative for dysuria and hematuria.  Musculoskeletal:  Negative for arthralgias, back pain, joint swelling and myalgias.  Skin:  Negative for rash.  Neurological:  Positive for numbness. Negative for dizziness, weakness and headaches.  Psychiatric/Behavioral:  Negative for dysphoric mood. The patient is not nervous/anxious.     Current Outpatient Medications on File Prior to Visit  Medication Sig Dispense Refill   amLODipine (NORVASC) 5 MG tablet Take 1  tablet (5 mg total) by mouth daily. 90 tablet 1   levothyroxine (SYNTHROID) 100 MCG tablet Take 1 tablet (100 mcg total) by mouth daily before breakfast. 90 tablet 3   losartan (COZAAR) 100 MG tablet Take 1 tablet (100 mg total) by mouth daily. 90 tablet 3   omeprazole (PRILOSEC) 40 MG capsule Take 1 capsule (40 mg total) by mouth daily. 90 capsule 3   traZODone (DESYREL) 150 MG tablet Take 1 tablet (150 mg total) by mouth at bedtime. 90 tablet 1   No current facility-administered medications on file prior to visit.   Past Medical History:  Diagnosis Date   Allergy    Arthritis    Cancer (HCC) 2022   left renal cancer, had left nephrectomy   GERD (gastroesophageal reflux disease)    Heart murmur    Hyperlipidemia    Hypertension    Sleep apnea    not currently using cpap   Thyroid disease    Past Surgical History:  Procedure Laterality Date   CHOLECYSTECTOMY, LAPAROSCOPIC  11/03/2022   LAPAROTOMY  1980s   tubal pregnancy, left fallopian tube removal   NEPHRECTOMY Left     Family History  Problem Relation Age of Onset   Alzheimer's disease Mother    Diabetes Mellitus II Mother    Hypertension Father    Stroke Sister    Hypertension Sister    Breast cancer Neg Hx    Social History   Socioeconomic History   Marital status: Married    Spouse name: Hadlee Burback   Number of children: 1   Years of education: Not on file   Highest education level: Not on file  Occupational History   Occupation: Conservation officer, nature   Occupation: Retired  Tobacco Use   Smoking status: Every Day    Current packs/day: 0.25    Average packs/day: 0.3 packs/day for 45.0 years (11.3 ttl pk-yrs)    Types: Cigarettes   Smokeless tobacco: Never  Vaping Use   Vaping status: Some Days  Substance and Sexual Activity   Alcohol use: Not Currently   Drug use: Never   Sexual activity: Yes    Partners: Male  Other Topics Concern   Not on file  Social History Narrative   Not on file   Social Drivers of  Health   Financial Resource Strain: Low Risk  (07/16/2021)   Overall Financial Resource Strain (CARDIA)    Difficulty of Paying Living Expenses: Not hard at all  Food Insecurity: No Food Insecurity (07/16/2021)   Hunger Vital Sign    Worried About Running Out of Food in the Last Year: Never true    Ran Out of Food in the Last Year: Never true  Transportation Needs: No Transportation Needs (07/16/2021)   PRAPARE - Transportation  Lack of Transportation (Medical): No    Lack of Transportation (Non-Medical): No  Physical Activity: Sufficiently Active (07/16/2021)   Exercise Vital Sign    Days of Exercise per Week: 5 days    Minutes of Exercise per Session: 30 min  Stress: No Stress Concern Present (07/16/2021)   [employer] of Occupational Health - Occupational Stress Questionnaire    Feeling of Stress : Not at all  Social Connections: Moderately Isolated (07/16/2021)   Social Connection and Isolation Panel [NHANES]    Frequency of Communication with Friends and Family: More than three times a week    Frequency of Social Gatherings with Friends and Family: More than three times a week    Attends Religious Services: Never    Database administrator or Organizations: No    Attends Engineer, structural: Never    Marital Status: Married    Objective:  BP 138/86 (BP Location: Left Arm, Patient Position: Sitting)   Pulse 86   Temp 97.8 F (36.6 C) (Temporal)   Ht 5\' 5"  (1.651 m)   Wt 228 lb (103.4 kg)   LMP  (LMP Unknown)   SpO2 98%   BMI 37.94 kg/m      03/14/2023    8:54 AM 12/06/2022    8:32 AM 10/13/2022    9:55 AM  BP/Weight  Systolic BP 138 120 120  Diastolic BP 86 70 80  Wt. (Lbs) 228 220 226  BMI 37.94 kg/m2 36.61 kg/m2 37.61 kg/m2    Physical Exam Vitals reviewed.  Constitutional:      Appearance: Normal appearance.  Cardiovascular:     Rate and Rhythm: Normal rate and regular rhythm.     Pulses:          Dorsalis pedis pulses are 1+ on the right  side and 2+ on the left side.       Posterior tibial pulses are 2+ on the right side and 2+ on the left side.     Heart sounds: Normal heart sounds.  Pulmonary:     Effort: Pulmonary effort is normal.     Breath sounds: Normal breath sounds.  Abdominal:     General: Bowel sounds are normal.     Palpations: Abdomen is soft.     Tenderness: There is no abdominal tenderness.  Musculoskeletal:     Right lower leg: Edema present.     Left lower leg: Edema present.  Neurological:     Mental Status: She is alert and oriented to person, place, and time.  Psychiatric:        Mood and Affect: Mood normal.        Behavior: Behavior normal.     Diabetic Foot Exam - Simple   No data filed      Lab Results  Component Value Date   WBC 6.7 03/14/2023   HGB 13.6 03/14/2023   HCT 41.9 03/14/2023   PLT 278 03/14/2023   GLUCOSE 87 03/14/2023   CHOL 183 03/14/2023   TRIG 192 (H) 03/14/2023   HDL 48 03/14/2023   LDLCALC 102 (H) 03/14/2023   ALT 23 03/14/2023   AST 23 03/14/2023   NA 139 03/14/2023   K 4.9 03/14/2023   CL 100 03/14/2023   CREATININE 1.73 (H) 03/14/2023   BUN 13 03/14/2023   CO2 23 03/14/2023   TSH 2.130 12/06/2022   HGBA1C 6.1 (H) 03/14/2023      Assessment & Plan:    Benign hypertension with stage 3a chronic  kidney disease [facility]) Assessment & Plan: Well controlled.  Continue to work on eating a healthy diet and exercise.  Labs drawn today.   No major side effects reported, and no issues with compliance. The current medical regimen is effective;  continue present plan with amlodipine 5mg , Losartan 100mg  Will adjust medication as needed depending on labs BP Readings from Last 3 Encounters:  03/14/23 138/86  12/06/22 120/70  10/13/22 120/80     Orders: -     CBC with Differential/Platelet -     Comprehensive metabolic panel  Gastroesophageal reflux disease without esophagitis Assessment & Plan: Controlled Continue to monitor symptoms Will adjust  treatment based on symptoms Continue taking Omeprazole 40mg  as directed.   Acquired hypothyroidism Assessment & Plan: Labs drawn today Continue to monitor for changing symptoms Continue taking Synthroid Will adjust treatment depending on resutls   Mixed hyperlipidemia Assessment & Plan: Well controlled.  Continue to work on eating a healthy diet and exercise.  Labs drawn today.   No major side effects reported, and no issues with compliance. The current medical regimen is effective;  continue present plan with Pravastatin 80mg  Will adjust medication as needed depending on labs Lab Results  Component Value Date   LDLCALC 102 (H) 03/14/2023     Orders: -     Lipid panel -     Pravastatin Sodium; TAKE 1 TABLET BY MOUTH DAILY  Dispense: 90 tablet; Refill: 3  Prediabetes Assessment & Plan: Controlled Continue to monitor diet and exercise Labs drawn today Will adjust treatment depending on labs  Orders: -     Hemoglobin A1c  Peripheral edema Assessment & Plan: Bilateral lower extremity swelling for the past few months, worse with activity. No associated dyspnea, chest pain, or color changes in the feet. -Collect urine sample today to rule out renal causes of edema.  Orders: -     Pro b natriuretic peptide (BNP)  Acute UTI Assessment & Plan: Urine showed signs of UTI Will send to culture to look for infection   Orders: -     POCT URINALYSIS DIP (CLINITEK) -     Urine Culture  Neuropathy of both feet Assessment & Plan: Severe burning pain every two days. Lyrica was effective but discontinued due to renal side effects. -Research alternative neuropathy medications that are metabolized by the liver and do not impact the kidneys.      Meds ordered this encounter  Medications   pravastatin (PRAVACHOL) 80 MG tablet    Sig: TAKE 1 TABLET BY MOUTH DAILY    Dispense:  90 tablet    Refill:  3    Please send a replace/new response with 90-Day Supply if  appropriate to maximize member benefit. Requesting 1 year supply.    Orders Placed This Encounter  Procedures   Urine Culture   CBC with Differential/Platelet   Comprehensive metabolic panel   Hemoglobin A1c   Lipid panel   Pro b natriuretic peptide   POCT URINALYSIS DIP (CLINITEK)    General Health Maintenance -Declined flu shot. -Reminded patient about colonoscopy preparation kit at home.      Follow-up: Return in about 3 months (around 06/11/2023) for Chronic, [place] Foley.   I,Marla I Leal-Borjas,acting as a scribe for [employer], PA.,have documented all relevant documentation on the behalf of Langley Gauss, PA,as directed by  Langley Gauss, PA while in the presence of Langley Gauss, [place].   An After Visit Summary was printed and given to the patient.  Langley Gauss, [place]  [facility] [phone]

## 2023-03-14 ENCOUNTER — Ambulatory Visit: Payer: 59 | Admitting: Physician Assistant

## 2023-03-14 ENCOUNTER — Encounter: Payer: Self-pay | Admitting: Physician Assistant

## 2023-03-14 VITALS — BP 138/86 | HR 86 | Temp 97.8°F | Ht 65.0 in | Wt 228.0 lb

## 2023-03-14 DIAGNOSIS — I129 Hypertensive chronic kidney disease with stage 1 through stage 4 chronic kidney disease, or unspecified chronic kidney disease: Secondary | ICD-10-CM

## 2023-03-14 DIAGNOSIS — N1831 Chronic kidney disease, stage 3a: Secondary | ICD-10-CM

## 2023-03-14 DIAGNOSIS — E039 Hypothyroidism, unspecified: Secondary | ICD-10-CM

## 2023-03-14 DIAGNOSIS — G5793 Unspecified mononeuropathy of bilateral lower limbs: Secondary | ICD-10-CM

## 2023-03-14 DIAGNOSIS — R600 Localized edema: Secondary | ICD-10-CM

## 2023-03-14 DIAGNOSIS — R7303 Prediabetes: Secondary | ICD-10-CM

## 2023-03-14 DIAGNOSIS — N390 Urinary tract infection, site not specified: Secondary | ICD-10-CM

## 2023-03-14 DIAGNOSIS — E782 Mixed hyperlipidemia: Secondary | ICD-10-CM

## 2023-03-14 DIAGNOSIS — K219 Gastro-esophageal reflux disease without esophagitis: Secondary | ICD-10-CM

## 2023-03-14 LAB — POCT URINALYSIS DIP (CLINITEK)
Bilirubin, UA: NEGATIVE
Blood, UA: NEGATIVE
Glucose, UA: NEGATIVE mg/dL
Ketones, POC UA: NEGATIVE mg/dL
Nitrite, UA: NEGATIVE
POC PROTEIN,UA: NEGATIVE
Spec Grav, UA: 1.02 (ref 1.010–1.025)
Urobilinogen, UA: NEGATIVE U/dL — AB
pH, UA: 6 (ref 5.0–8.0)

## 2023-03-14 MED ORDER — PRAVASTATIN SODIUM 80 MG PO TABS: ORAL_TABLET | ORAL | 3 refills | Status: DC

## 2023-03-15 LAB — CBC WITH DIFFERENTIAL/PLATELET
Basophils Absolute: 0.1 10*3/uL (ref 0.0–0.2)
Basos: 1 %
EOS (ABSOLUTE): 0.1 10*3/uL (ref 0.0–0.4)
Eos: 1 %
Hematocrit: 41.9 % (ref 34.0–46.6)
Hemoglobin: 13.6 g/dL (ref 11.1–15.9)
Immature Grans (Abs): 0 10*3/uL (ref 0.0–0.1)
Immature Granulocytes: 0 %
Lymphocytes Absolute: 2.8 10*3/uL (ref 0.7–3.1)
Lymphs: 42 %
MCH: 30.8 pg (ref 26.6–33.0)
MCHC: 32.5 g/dL (ref 31.5–35.7)
MCV: 95 fL (ref 79–97)
Monocytes Absolute: 0.5 10*3/uL (ref 0.1–0.9)
Monocytes: 7 %
Neutrophils Absolute: 3.3 10*3/uL (ref 1.4–7.0)
Neutrophils: 49 %
Platelets: 278 10*3/uL (ref 150–450)
RBC: 4.42 x10E6/uL (ref 3.77–5.28)
RDW: 12.7 % (ref 11.7–15.4)
WBC: 6.7 10*3/uL (ref 3.4–10.8)

## 2023-03-15 LAB — PRO B NATRIURETIC PEPTIDE: NT-Pro BNP: <36 pg/mL (ref 0–287)

## 2023-03-15 LAB — LIPID PANEL
Chol/HDL Ratio: 3.8 {ratio} (ref 0.0–4.4)
Cholesterol, Total: 183 mg/dL (ref 100–199)
HDL: 48 mg/dL (ref >39)
LDL Chol Calc (NIH): 102 mg/dL — ABNORMAL HIGH (ref 0–99)
Triglycerides: 192 mg/dL — ABNORMAL HIGH (ref 0–149)
VLDL Cholesterol Cal: 33 mg/dL (ref 5–40)

## 2023-03-15 LAB — COMPREHENSIVE METABOLIC PANEL
ALT: 23 [IU]/L (ref 0–32)
AST: 23 [IU]/L (ref 0–40)
Albumin: 4.3 g/dL (ref 3.9–4.9)
Alkaline Phosphatase: 107 [IU]/L (ref 44–121)
BUN/Creatinine Ratio: 8 — ABNORMAL LOW (ref 12–28)
BUN: 13 mg/dL (ref 8–27)
Bilirubin Total: 0.3 mg/dL (ref 0.0–1.2)
CO2: 23 mmol/L (ref 20–29)
Calcium: 10 mg/dL (ref 8.7–10.3)
Chloride: 100 mmol/L (ref 96–106)
Creatinine, Ser: 1.73 mg/dL — ABNORMAL HIGH (ref 0.57–1.00)
Globulin, Total: 2.8 g/dL (ref 1.5–4.5)
Glucose: 87 mg/dL (ref 70–99)
Potassium: 4.9 mmol/L (ref 3.5–5.2)
Sodium: 139 mmol/L (ref 134–144)
Total Protein: 7.1 g/dL (ref 6.0–8.5)
eGFR: 33 mL/min/{1.73_m2} — ABNORMAL LOW (ref >59)

## 2023-03-15 LAB — HEMOGLOBIN A1C
Est. average glucose Bld gHb Est-mCnc: 128 mg/dL
Hgb A1c MFr Bld: 6.1 % — ABNORMAL HIGH (ref 4.8–5.6)

## 2023-03-16 LAB — URINE CULTURE

## 2023-03-17 ENCOUNTER — Other Ambulatory Visit: Payer: Self-pay | Admitting: Physician Assistant

## 2023-03-17 DIAGNOSIS — N390 Urinary tract infection, site not specified: Secondary | ICD-10-CM

## 2023-03-17 DIAGNOSIS — R600 Localized edema: Secondary | ICD-10-CM | POA: Insufficient documentation

## 2023-03-17 MED ORDER — CIPROFLOXACIN HCL 500 MG PO TABS: 500.0000 mg | ORAL_TABLET | Freq: Every day | ORAL | 0 refills | Status: AC

## 2023-03-17 NOTE — Assessment & Plan Note (Signed)
 Severe burning pain every two days. Lyrica was effective but discontinued due to renal side effects. -Research alternative neuropathy medications that are metabolized by the liver and do not impact the kidneys.

## 2023-03-17 NOTE — Assessment & Plan Note (Signed)
 Well controlled.  Continue to work on eating a healthy diet and exercise.  Labs drawn today.   No major side effects reported, and no issues with compliance. The current medical regimen is effective;  continue present plan with amlodipine 5mg , Losartan 100mg  Will adjust medication as needed depending on labs BP Readings from Last 3 Encounters:  03/14/23 138/86  12/06/22 120/70  10/13/22 120/80

## 2023-03-17 NOTE — Assessment & Plan Note (Signed)
 Well controlled.  Continue to work on eating a healthy diet and exercise.  Labs drawn today.   No major side effects reported, and no issues with compliance. The current medical regimen is effective;  continue present plan with Pravastatin 80mg  Will adjust medication as needed depending on labs Lab Results  Component Value Date   LDLCALC 102 (H) 03/14/2023

## 2023-03-17 NOTE — Assessment & Plan Note (Signed)
 Bilateral lower extremity swelling for the past few months, worse with activity. No associated dyspnea, chest pain, or color changes in the feet. -Collect urine sample today to rule out renal causes of edema.

## 2023-03-17 NOTE — Assessment & Plan Note (Signed)
 Labs drawn today Continue to monitor for changing symptoms Continue taking Synthroid Will adjust treatment depending on resutls

## 2023-03-17 NOTE — Assessment & Plan Note (Signed)
 Controlled Continue to monitor symptoms Will adjust treatment based on symptoms Continue taking Omeprazole 40mg  as directed.

## 2023-03-17 NOTE — Assessment & Plan Note (Signed)
 Urine showed signs of UTI Will send to culture to look for infection

## 2023-03-17 NOTE — Assessment & Plan Note (Signed)
 Controlled Continue to monitor diet and exercise Labs drawn today Will adjust treatment depending on labs

## 2023-03-18 ENCOUNTER — Other Ambulatory Visit: Payer: Self-pay | Admitting: Physician Assistant

## 2023-03-18 ENCOUNTER — Telehealth: Payer: Self-pay | Admitting: Physician Assistant

## 2023-03-18 NOTE — Telephone Encounter (Signed)
 Copied from CRM [id number]. Topic: Clinical - Prescription Issue >> Mar 18, 2023 12:16 PM Antwanette L wrote: Reason for CRM: Patient is calling in about prescription that Dr. Langley Gauss wrote out. The prescription is for fluconazole (DIFLUCAN) 150 MG tablet(2 pills). The order was put in today but the pharmacy will not release the prescription to the patient husband. [facility] closed today at 12pm. Patient is requesting a callback at [phone]

## 2023-03-21 ENCOUNTER — Other Ambulatory Visit: Payer: Self-pay

## 2023-03-21 MED ORDER — FLUCONAZOLE 150 MG PO TABS: 150.0000 mg | ORAL_TABLET | Freq: Every day | ORAL | 0 refills | Status: DC

## 2023-03-21 NOTE — Telephone Encounter (Signed)
 Called patient  made her aware rx was resent to pharmacy.

## 2023-04-26 ENCOUNTER — Encounter: Payer: Self-pay | Admitting: Physician Assistant

## 2023-04-26 ENCOUNTER — Ambulatory Visit: Payer: Self-pay | Admitting: Physician Assistant

## 2023-04-26 VITALS — BP 137/72 | HR 88 | Temp 97.6°F | Ht 65.0 in | Wt 230.0 lb

## 2023-04-26 DIAGNOSIS — N1832 Chronic kidney disease, stage 3b: Secondary | ICD-10-CM | POA: Insufficient documentation

## 2023-04-26 DIAGNOSIS — M7632 Iliotibial band syndrome, left leg: Secondary | ICD-10-CM | POA: Insufficient documentation

## 2023-04-26 MED ORDER — OXYCODONE-ACETAMINOPHEN 7.5-325 MG PO TABS: 1.0000 | ORAL_TABLET | ORAL | 0 refills | Status: AC | PRN

## 2023-04-26 NOTE — Progress Notes (Signed)
 Acute Office Visit  Subjective:    Patient ID: Sandy Beck, female    DOB: September 12, 1960, 63 y.o.   MRN: [id number]  Chief Complaint  Patient presents with   Left leg pain     Discussed the use of AI scribe software for clinical note transcription with the patient, who gave verbal consent to proceed.  History of Present Illness   The patient, with a history of COVID-19, presents with severe, sharp, and constant pain in the left hip and knee. The pain started after stepping onto a kitchen chair and has been progressively worsening. The pain radiates from the hip to the knee, making it difficult for the patient to wear pants and causing discomfort when sitting. The patient has been self-medicating with pain pills, which provide temporary relief. The patient also reports experiencing pain throughout the day and night, with no associated numbness or tingling. The patient has not experienced this type of pain before.       Past Medical History:  Diagnosis Date   Allergy    Arthritis    Cancer (HCC) 2022   left renal cancer, had left nephrectomy   GERD (gastroesophageal reflux disease)    Heart murmur    Hyperlipidemia    Hypertension    Sleep apnea    not currently using cpap   Thyroid disease     Past Surgical History:  Procedure Laterality Date   CHOLECYSTECTOMY, LAPAROSCOPIC  11/03/2022   LAPAROTOMY  1980s   tubal pregnancy, left fallopian tube removal   NEPHRECTOMY Left     Family History  Problem Relation Age of Onset   Alzheimer's disease Mother    Diabetes Mellitus II Mother    Hypertension Father    Stroke Sister    Hypertension Sister    Breast cancer Neg Hx     Social History   Socioeconomic History   Marital status: Married    Spouse name: Ardath Lepak   Number of children: 1   Years of education: Not on file   Highest education level: Not on file  Occupational History   Occupation: Conservation officer, nature   Occupation: Retired  Tobacco Use   Smoking status:  Every Day    Current packs/day: 0.25    Average packs/day: 0.3 packs/day for 45.0 years (11.3 ttl pk-yrs)    Types: Cigarettes   Smokeless tobacco: Never  Vaping Use   Vaping status: Some Days  Substance and Sexual Activity   Alcohol use: Not Currently   Drug use: Never   Sexual activity: Yes    Partners: Male  Other Topics Concern   Not on file  Social History Narrative   Not on file   Social Drivers of Health   Financial Resource Strain: Low Risk  (07/16/2021)   Overall Financial Resource Strain (CARDIA)    Difficulty of Paying Living Expenses: Not hard at all  Food Insecurity: No Food Insecurity (07/16/2021)   Hunger Vital Sign    Worried About Running Out of Food in the Last Year: Never true    Ran Out of Food in the Last Year: Never true  Transportation Needs: No Transportation Needs (07/16/2021)   PRAPARE - Administrator, Civil Service (Medical): No    Lack of Transportation (Non-Medical): No  Physical Activity: Sufficiently Active (07/16/2021)   Exercise Vital Sign    Days of Exercise per Week: 5 days    Minutes of Exercise per Session: 30 min  Stress: No Stress Concern Present (07/16/2021)  [employer] of Occupational Health - Occupational Stress Questionnaire    Feeling of Stress : Not at all  Social Connections: Moderately Isolated (07/16/2021)   Social Connection and Isolation Panel [NHANES]    Frequency of Communication with Friends and Family: More than three times a week    Frequency of Social Gatherings with Friends and Family: More than three times a week    Attends Religious Services: Never    Database administrator or Organizations: No    Attends Banker Meetings: Never    Marital Status: Married  Catering manager Violence: Not At Risk (07/16/2021)   Humiliation, Afraid, Rape, and Kick questionnaire    Fear of Current or Ex-Partner: No    Emotionally Abused: No    Physically Abused: No    Sexually Abused: No    Outpatient  Medications Prior to Visit  Medication Sig Dispense Refill   amLODipine (NORVASC) 5 MG tablet Take 1 tablet (5 mg total) by mouth daily. 90 tablet 1   levothyroxine (SYNTHROID) 100 MCG tablet Take 1 tablet (100 mcg total) by mouth daily before breakfast. 90 tablet 3   losartan (COZAAR) 100 MG tablet Take 1 tablet (100 mg total) by mouth daily. 90 tablet 3   omeprazole (PRILOSEC) 40 MG capsule Take 1 capsule (40 mg total) by mouth daily. 90 capsule 3   pravastatin (PRAVACHOL) 80 MG tablet TAKE 1 TABLET BY MOUTH DAILY 90 tablet 3   traZODone (DESYREL) 150 MG tablet Take 1 tablet (150 mg total) by mouth at bedtime. 90 tablet 1   fluconazole (DIFLUCAN) 150 MG tablet Take 1 tablet (150 mg total) by mouth daily. 2 tablet 0   No facility-administered medications prior to visit.    Allergies  Allergen Reactions   Wound Dressing Adhesive Other (See Comments)    Tears skin   Bactrim [Sulfamethoxazole-Trimethoprim]     Nausea and upset stomach   Meloxicam Other (See Comments)   Sulfa Antibiotics Other (See Comments)   Macrobid [Nitrofurantoin] Nausea And Vomiting    Review of Systems  Constitutional:  Negative for appetite change, fatigue and fever.  HENT:  Negative for congestion, ear pain, sinus pressure and sore throat.   Respiratory:  Negative for cough, chest tightness, shortness of breath and wheezing.   Cardiovascular:  Negative for chest pain and palpitations.  Gastrointestinal:  Negative for abdominal pain, constipation, diarrhea, nausea and vomiting.  Genitourinary:  Negative for dysuria and hematuria.  Musculoskeletal:  Positive for myalgias (Left leg pain). Negative for arthralgias, back pain and joint swelling.  Skin:  Negative for rash.  Neurological:  Negative for dizziness, weakness and headaches.  Psychiatric/Behavioral:  Negative for dysphoric mood. The patient is not nervous/anxious.        Objective:        04/26/2023   10:03 AM 03/14/2023    8:54 AM 12/06/2022     8:32 AM  Vitals with BMI  Height 5\' 5"  5\' 5"  5\' 5"   Weight 230 lbs 228 lbs 220 lbs  BMI 38.27 37.94 36.61  Systolic 137 138 409  Diastolic 72 86 70  Pulse 88 86 72    Orthostatic VS for the past 72 hrs (Last 3 readings):  Patient Position BP Location  04/26/23 1003 Sitting Left Arm     Physical Exam Vitals reviewed.  Constitutional:      Appearance: Normal appearance.  Neck:     Vascular: No carotid bruit.  Cardiovascular:     Rate and Rhythm: Normal  rate and regular rhythm.     Heart sounds: Normal heart sounds.  Pulmonary:     Effort: Pulmonary effort is normal.     Breath sounds: Normal breath sounds.  Abdominal:     General: Bowel sounds are normal.     Palpations: Abdomen is soft.     Tenderness: There is no abdominal tenderness.  Musculoskeletal:     Right hip: Normal.     Left hip: Tenderness present. No bony tenderness or crepitus. Normal range of motion.     Right upper leg: Normal.     Left upper leg: Tenderness present. No edema or deformity.     Right knee: Normal.     Left knee: No swelling, deformity, erythema or bony tenderness. Tenderness present over the lateral joint line. No LCL tenderness. No LCL laxity or MCL laxity. Neurological:     Mental Status: She is alert and oriented to person, place, and time.  Psychiatric:        Mood and Affect: Mood normal.        Behavior: Behavior normal.     Health Maintenance Due  Topic Date Due   Pneumococcal Vaccine 87-9 Years old (1 of 2 - PCV) Never done   Zoster Vaccines- Shingrix (1 of 2) Never done   Colonoscopy  Never done    There are no preventive care reminders to display for this patient.   Lab Results  Component Value Date   TSH 2.130 12/06/2022   Lab Results  Component Value Date   WBC 6.7 03/14/2023   HGB 13.6 03/14/2023   HCT 41.9 03/14/2023   MCV 95 03/14/2023   PLT 278 03/14/2023   Lab Results  Component Value Date   NA 139 03/14/2023   K 4.9 03/14/2023   CO2 23 03/14/2023    GLUCOSE 87 03/14/2023   BUN 13 03/14/2023   CREATININE 1.73 (H) 03/14/2023   BILITOT 0.3 03/14/2023   ALKPHOS 107 03/14/2023   AST 23 03/14/2023   ALT 23 03/14/2023   PROT 7.1 03/14/2023   ALBUMIN 4.3 03/14/2023   CALCIUM 10.0 03/14/2023   EGFR 33 (L) 03/14/2023   Lab Results  Component Value Date   CHOL 183 03/14/2023   Lab Results  Component Value Date   HDL 48 03/14/2023   Lab Results  Component Value Date   LDLCALC 102 (H) 03/14/2023   Lab Results  Component Value Date   TRIG 192 (H) 03/14/2023   Lab Results  Component Value Date   CHOLHDL 3.8 03/14/2023   Lab Results  Component Value Date   HGBA1C 6.1 (H) 03/14/2023       Assessment & Plan:  It band syndrome, left Assessment & Plan: Suspected IT band syndrome due to anatomical location and symptom description. Advised on non-pharmacological management strategies including ice application, stretching, and use of a foam roller or frozen water bottle for massage. Prefers to try these measures before considering physical therapy. - Apply ice to the affected area to manage pain. - Perform IT band stretches as instructed. - Use a foam roller or frozen water bottle to massage the IT band. - Consider physical therapy if symptoms do not improve with home management.  Orders: -     oxyCODONE-Acetaminophen; Take 1 tablet by mouth every 4 (four) hours as needed for up to 5 days for severe pain (pain score 7-10).  Dispense: 20 tablet; Refill: 0  CKD stage 3b, GFR 30-44 ml/min (HCC) Assessment & Plan: Chronic kidney disease  complicates choice of pain management medications. Previously discontinued Lyrica due to urinary side effects. Cautious about prescribing medications that could affect kidney function. Considering options that are less nephrotoxic. Meloxicam is considered but needs careful dosing due to kidney processing. - Prescribe a pain management medication that is safe for kidney function and less intense than  ibuprofen. - Send prescription to [facility] in [place].        Meds ordered this encounter  Medications   oxyCODONE-acetaminophen (PERCOCET) 7.5-325 MG tablet    Sig: Take 1 tablet by mouth every 4 (four) hours as needed for up to 5 days for severe pain (pain score 7-10).    Dispense:  20 tablet    Refill:  0    No orders of the defined types were placed in this encounter.    Follow-up: No follow-ups on file.  An After Visit Summary was printed and given to the patient.    I,Lauren M Auman,acting as a Neurosurgeon for [employer], [employer].,have documented all relevant documentation on the behalf of Langley Gauss, PA,as directed by  Langley Gauss, PA while in the presence of Langley Gauss, [place].    Langley Gauss, [place] [facility] [phone]

## 2023-04-26 NOTE — Assessment & Plan Note (Signed)
 Suspected IT band syndrome due to anatomical location and symptom description. Advised on non-pharmacological management strategies including ice application, stretching, and use of a foam roller or frozen water bottle for massage. Prefers to try these measures before considering physical therapy. - Apply ice to the affected area to manage pain. - Perform IT band stretches as instructed. - Use a foam roller or frozen water bottle to massage the IT band. - Consider physical therapy if symptoms do not improve with home management.

## 2023-04-26 NOTE — Assessment & Plan Note (Signed)
 Chronic kidney disease complicates choice of pain management medications. Previously discontinued Lyrica due to urinary side effects. Cautious about prescribing medications that could affect kidney function. Considering options that are less nephrotoxic. Meloxicam is considered but needs careful dosing due to kidney processing. - Prescribe a pain management medication that is safe for kidney function and less intense than ibuprofen. - Send prescription to [facility] in [place].

## 2023-05-25 ENCOUNTER — Ambulatory Visit: Payer: Self-pay

## 2023-05-25 NOTE — Telephone Encounter (Signed)
 Chief Complaint: Back pain Symptoms: back pain Frequency: 1 week Pertinent Negatives: Patient denies fever, pain with urination, blood in urine Disposition: [] ED /[] Urgent Care (no appt availability in office) / [x] Appointment(In office/virtual)/ []  [facility]/ [] Home Care/ [] Refused Recommended Disposition /[] [place] [facility]/ []  Follow-up with PCP Additional Notes: Patient called stating she is having back pain that has been present for 1 week. Patient states pain is spreading across lower back/flank area. Patient only has one kidney due to hx of kidney cancer and having kidney removed. Patient states when she has a UTI, she does not normally have urinary symptoms and is concerned she may have a UTI. Patient is unable to come in until Friday due to her father's passing and making arrangements for that. Patient appt made for Friday. Patient asking if she can get her bloodwork that is originally scheduled for later in the month done at this appt as well.    Copied from CRM [id number]. Topic: Clinical - Red Word Triage >> May 25, 2023 12:52 PM Fredrica W wrote: Red Word that prompted transfer to Nurse Triage: Lower back pain only has one kidney Reason for Disposition  [1] MODERATE back pain (e.g., interferes with normal activities) AND [2] present > 3 days  Answer Assessment - Initial Assessment Questions 1. ONSET: "When did the pain begin?"      A week 2. LOCATION: "Where does it hurt?" (upper, mid or lower back)     Across lower back/flank area 3. SEVERITY: "How bad is the pain?"  (e.g., Scale 1-10; mild, moderate, or severe)   - MILD (1-3): Doesn't interfere with normal activities.    - MODERATE (4-7): Interferes with normal activities or awakens from sleep.    - SEVERE (8-10): Excruciating pain, unable to do any normal activities.      6 4. PATTERN: "Is the pain constant?" (e.g., yes, no; constant, intermittent)      Constant 5. RADIATION: "Does the pain shoot into your  legs or somewhere else?"     No 6. CAUSE:  "What do you think is causing the back pain?"      Patient curious if UTI 7. BACK OVERUSE:  "Any recent lifting of heavy objects, strenuous work or exercise?"     No 8. MEDICINES: "What have you taken so far for the pain?" (e.g., nothing, acetaminophen , NSAIDS)     Tylenol  - doesn't help 9. NEUROLOGIC SYMPTOMS: "Do you have any weakness, numbness, or problems with bowel/bladder control?"     No 10. OTHER SYMPTOMS: "Do you have any other symptoms?" (e.g., fever, abdomen pain, burning with urination, blood in urine)      No  Protocols used: Back Pain-A-AH

## 2023-05-26 NOTE — Progress Notes (Signed)
 Acute Office Visit  Subjective:    Patient ID: Sandy Beck, female    DOB: Oct 26, 1960, 63 y.o.   MRN: [id number]  Chief Complaint  Patient presents with   Back Pain    HPI: Patient is in today for lower back pain since one week ago. She denies dysuria, flank pain. She has one kidney, so she wants to be check for UTI  Discussed the use of AI scribe software for clinical note transcription with the patient, who gave verbal consent to proceed.  History of Present Illness   Sandy Beck is a 63 year old female with a history of kidney cancer who presents with recurrent urinary tract infections and lower back pain.  She experiences recurrent urinary tract infections approximately five to six times a year, often without typical symptoms such as dysuria, becoming aware of the infection only when it is advanced. Despite increasing her water intake, the frequency of UTIs persists. Recent urinalysis showed high white blood cells, indicating a UTI. She requires antibiotics and antifungal medication for her UTIs.  She experiences lower back pain, which she associates with her recurrent UTIs. There is no lower abdominal pain or symptoms suggesting kidney involvement at this time. Her leg pain has improved with a change in medication, although it occasionally bothers her.  Her past medical history is significant for kidney cancer, which resulted in the removal of one kidney. She expresses concern about the cost of medical tests and is looking forward to obtaining Medicare coverage.  Socially, she is under significant stress due to caregiving responsibilities for her sister, who is handicapped following a stroke. This situation is causing her physical and emotional strain, as she is responsible for her sister's care multiple times a week. Her sister has a history of substance abuse, which has impacted her health and living situation.  She is currently taking pravastatin  and trazodone , with a  preference for three-month supplies.       Past Medical History:  Diagnosis Date   Allergy    Arthritis    Cancer (HCC) 2022   left renal cancer, had left nephrectomy   GERD (gastroesophageal reflux disease)    Heart murmur    Hyperlipidemia    Hypertension    Sleep apnea    not currently using cpap   Thyroid disease     Past Surgical History:  Procedure Laterality Date   CHOLECYSTECTOMY, LAPAROSCOPIC  11/03/2022   LAPAROTOMY  1980s   tubal pregnancy, left fallopian tube removal   NEPHRECTOMY Left     Family History  Problem Relation Age of Onset   Alzheimer's disease Mother    Diabetes Mellitus II Mother    Hypertension Father    Stroke Sister    Hypertension Sister    Breast cancer Neg Hx     Social History   Socioeconomic History   Marital status: Married    Spouse name: Nohealani Laity   Number of children: 1   Years of education: Not on file   Highest education level: Not on file  Occupational History   Occupation: Conservation officer, nature   Occupation: Retired  Tobacco Use   Smoking status: Every Day    Current packs/day: 0.25    Average packs/day: 0.3 packs/day for 45.0 years (11.3 ttl pk-yrs)    Types: Cigarettes   Smokeless tobacco: Never  Vaping Use   Vaping status: Some Days  Substance and Sexual Activity   Alcohol use: Not Currently   Drug use: Never   Sexual  activity: Yes    Partners: Male  Other Topics Concern   Not on file  Social History Narrative   Not on file   Social Drivers of Health   Financial Resource Strain: Low Risk  (07/16/2021)   Overall Financial Resource Strain (CARDIA)    Difficulty of Paying Living Expenses: Not hard at all  Food Insecurity: No Food Insecurity (07/16/2021)   Hunger Vital Sign    Worried About Running Out of Food in the Last Year: Never true    Ran Out of Food in the Last Year: Never true  Transportation Needs: No Transportation Needs (07/16/2021)   PRAPARE - Administrator, Civil Service (Medical): No     Lack of Transportation (Non-Medical): No  Physical Activity: Sufficiently Active (07/16/2021)   Exercise Vital Sign    Days of Exercise per Week: 5 days    Minutes of Exercise per Session: 30 min  Stress: No Stress Concern Present (07/16/2021)   [employer] of Occupational Health - Occupational Stress Questionnaire    Feeling of Stress : Not at all  Social Connections: Moderately Isolated (07/16/2021)   Social Connection and Isolation Panel [NHANES]    Frequency of Communication with Friends and Family: More than three times a week    Frequency of Social Gatherings with Friends and Family: More than three times a week    Attends Religious Services: Never    Database administrator or Organizations: No    Attends Banker Meetings: Never    Marital Status: Married  Catering manager Violence: Not At Risk (07/16/2021)   Humiliation, Afraid, Rape, and Kick questionnaire    Fear of Current or Ex-Partner: No    Emotionally Abused: No    Physically Abused: No    Sexually Abused: No    Outpatient Medications Prior to Visit  Medication Sig Dispense Refill   traZODone  (DESYREL ) 150 MG tablet Take 1 tablet (150 mg total) by mouth at bedtime. 90 tablet 1   amLODipine  (NORVASC ) 5 MG tablet Take 1 tablet (5 mg total) by mouth daily. 90 tablet 1   levothyroxine  (SYNTHROID ) 100 MCG tablet Take 1 tablet (100 mcg total) by mouth daily before breakfast. 90 tablet 3   losartan  (COZAAR ) 100 MG tablet Take 1 tablet (100 mg total) by mouth daily. 90 tablet 3   omeprazole  (PRILOSEC) 40 MG capsule Take 1 capsule (40 mg total) by mouth daily. 90 capsule 3   pravastatin  (PRAVACHOL ) 80 MG tablet TAKE 1 TABLET BY MOUTH DAILY 90 tablet 3   No facility-administered medications prior to visit.    Allergies  Allergen Reactions   Wound Dressing Adhesive Other (See Comments)    Tears skin   Bactrim [Sulfamethoxazole-Trimethoprim]     Nausea and upset stomach   Meloxicam  Other (See Comments)    Sulfa Antibiotics Other (See Comments)   Macrobid  [Nitrofurantoin ] Nausea And Vomiting    Review of Systems  Constitutional:  Negative for chills, fatigue and fever.  HENT:  Negative for congestion, ear pain and sore throat.   Respiratory:  Negative for cough and shortness of breath.   Cardiovascular:  Negative for chest pain and palpitations.  Gastrointestinal:  Negative for abdominal pain, constipation, diarrhea, nausea and vomiting.  Endocrine: Negative for polydipsia, polyphagia and polyuria.  Genitourinary:  Negative for difficulty urinating and dysuria.  Musculoskeletal:  Positive for back pain. Negative for arthralgias and myalgias.  Skin:  Negative for rash.  Neurological:  Negative for headaches.  Psychiatric/Behavioral:  Negative for dysphoric mood. The patient is not nervous/anxious.        Objective:         05/27/2023    8:44 AM 04/26/2023   10:03 AM 03/14/2023    8:54 AM  Vitals with BMI  Height 5\' 5"  5\' 5"  5\' 5"   Weight 228 lbs 230 lbs 228 lbs  BMI 37.94 38.27 37.94  Systolic 120 137 130  Diastolic 80 72 86  Pulse 69 88 86    No data found.   Physical Exam Vitals reviewed.  Constitutional:      Appearance: Normal appearance.  Cardiovascular:     Rate and Rhythm: Normal rate and regular rhythm.     Heart sounds: Normal heart sounds.  Pulmonary:     Effort: Pulmonary effort is normal.     Breath sounds: Normal breath sounds.  Abdominal:     General: Bowel sounds are normal.     Palpations: Abdomen is soft.     Tenderness: There is no abdominal tenderness. There is right CVA tenderness.  Neurological:     Mental Status: She is alert and oriented to person, place, and time.  Psychiatric:        Mood and Affect: Mood normal.        Behavior: Behavior normal.     Health Maintenance Due  Topic Date Due   Pneumococcal Vaccine 30-87 Years old (1 of 2 - PCV) Never done   Zoster Vaccines- Shingrix (1 of 2) Never done   Colonoscopy  Never done     There are no preventive care reminders to display for this patient.   Lab Results  Component Value Date   TSH 2.130 12/06/2022   Lab Results  Component Value Date   WBC 6.7 03/14/2023   HGB 13.6 03/14/2023   HCT 41.9 03/14/2023   MCV 95 03/14/2023   PLT 278 03/14/2023   Lab Results  Component Value Date   NA 139 03/14/2023   K 4.9 03/14/2023   CO2 23 03/14/2023   GLUCOSE 87 03/14/2023   BUN 13 03/14/2023   CREATININE 1.73 (H) 03/14/2023   BILITOT 0.3 03/14/2023   ALKPHOS 107 03/14/2023   AST 23 03/14/2023   ALT 23 03/14/2023   PROT 7.1 03/14/2023   ALBUMIN 4.3 03/14/2023   CALCIUM 10.0 03/14/2023   EGFR 33 (L) 03/14/2023   Lab Results  Component Value Date   CHOL 183 03/14/2023   Lab Results  Component Value Date   HDL 48 03/14/2023   Lab Results  Component Value Date   LDLCALC 102 (H) 03/14/2023   Lab Results  Component Value Date   TRIG 192 (H) 03/14/2023   Lab Results  Component Value Date   CHOLHDL 3.8 03/14/2023   Lab Results  Component Value Date   HGBA1C 6.1 (H) 03/14/2023       Assessment & Plan:  Acute bilateral low back pain without sciatica Assessment & Plan: Positive for UTI Will treat UTI and see if symptoms improve Return for follow up if symptoms continue  Orders: -     POCT urinalysis dipstick -     Urine Culture  Benign hypertension with stage 3a chronic kidney disease (HCC) Assessment & Plan: Well controlled.  Continue to work on eating a healthy diet and exercise.  Labs drawn today.   No major side effects reported, and no issues with compliance. The current medical regimen is effective;  continue present plan with amlodipine  5mg , Losartan  100mg  Will adjust  medication as needed depending on labs BP Readings from Last 3 Encounters:  05/27/23 120/80  04/26/23 137/72  03/14/23 138/86     Orders: -     amLODIPine  Besylate; Take 1 tablet (5 mg total) by mouth daily.  Dispense: 90 tablet; Refill: 1 -     Losartan   Potassium; Take 1 tablet (100 mg total) by mouth daily.  Dispense: 90 tablet; Refill: 3 -     Comprehensive metabolic panel with GFR  Acquired hypothyroidism Assessment & Plan: Labs drawn today Continue to monitor for changing symptoms Continue taking Synthroid  100mcg Will adjust treatment depending on resutls  Orders: -     Levothyroxine  Sodium; Take 1 tablet (100 mcg total) by mouth daily before breakfast.  Dispense: 90 tablet; Refill: 3 -     CBC with Differential/Platelet -     TSH  Gastroesophageal reflux disease, unspecified whether esophagitis present Assessment & Plan: Controlled Continue to monitor symptoms Will adjust treatment based on symptoms Continue taking Omeprazole  40mg  as directed.  Orders: -     Omeprazole ; Take 1 capsule (40 mg total) by mouth daily.  Dispense: 90 capsule; Refill: 3  Mixed hyperlipidemia Assessment & Plan: Well controlled.  Continue to work on eating a healthy diet and exercise.  Labs drawn today.   No major side effects reported, and no issues with compliance. The current medical regimen is effective;  continue present plan with Pravastatin  80mg  Will adjust medication as needed depending on labs Lab Results  Component Value Date   LDLCALC 102 (H) 03/14/2023     Orders: -     Pravastatin  Sodium; TAKE 1 TABLET BY MOUTH DAILY  Dispense: 90 tablet; Refill: 3 -     Lipid panel  Acute UTI Assessment & Plan: Recurrent UTIs, 5-6 times annually. Current urinalysis shows elevated WBCs. Risk of kidney involvement if untreated. History of kidney cancer may contribute to recurrence. - Refer to urologist Dr. Quinton Buckler for evaluation. She wants to call to set appointment. Will send referral if needed - Prescribe antibiotic for current UTI, adjust for renal function. - Prescribe two antifungal pills for yeast infection prevention. - Discuss low-dose prophylactic antibiotic for UTI prevention.  Orders: -     Fluconazole ; Take 1 tablet  (150 mg total) by mouth once for 1 dose.  Dispense: 2 tablet; Refill: 0 -     Ciprofloxacin  HCl; Take 1 tablet (500 mg total) by mouth daily for 10 days.  Dispense: 10 tablet; Refill: 0  Renal cell carcinoma of left kidney [facility]) Assessment & Plan: Previous kidney cancer with no current evidence of recurrence. Monitoring necessary due to recurrent UTIs. - Monitor renal function and watch for signs of recurrence.     General health maintenance Routine health maintenance and monitoring of chronic conditions. Blood work planned. Discussion about Medicare and insurance options. - Perform blood work for thyroid function, blood count, cholesterol, and A1c. - Discuss Medicare and insurance options for future coverage.       Meds ordered this encounter  Medications   amLODipine  (NORVASC ) 5 MG tablet    Sig: Take 1 tablet (5 mg total) by mouth daily.    Dispense:  90 tablet    Refill:  1   levothyroxine  (SYNTHROID ) 100 MCG tablet    Sig: Take 1 tablet (100 mcg total) by mouth daily before breakfast.    Dispense:  90 tablet    Refill:  3    Requesting 1 year supply   losartan  (COZAAR ) 100  MG tablet    Sig: Take 1 tablet (100 mg total) by mouth daily.    Dispense:  90 tablet    Refill:  3    Please send a replace/new response with 90-Day Supply if appropriate to maximize member benefit. Requesting 1 year supply.   omeprazole  (PRILOSEC) 40 MG capsule    Sig: Take 1 capsule (40 mg total) by mouth daily.    Dispense:  90 capsule    Refill:  3    Requesting 1 year supply   pravastatin  (PRAVACHOL ) 80 MG tablet    Sig: TAKE 1 TABLET BY MOUTH DAILY    Dispense:  90 tablet    Refill:  3    Please send a replace/new response with 90-Day Supply if appropriate to maximize member benefit. Requesting 1 year supply.   fluconazole  (DIFLUCAN ) 150 MG tablet    Sig: Take 1 tablet (150 mg total) by mouth once for 1 dose.    Dispense:  2 tablet    Refill:  0    Supervising Provider:   COX, KIRSTEN  [[id number]]   ciprofloxacin  (CIPRO ) 500 MG tablet    Sig: Take 1 tablet (500 mg total) by mouth daily for 10 days.    Dispense:  10 tablet    Refill:  0    Supervising Provider:   COX, KIRSTEN [id number]    Orders Placed This Encounter  Procedures   Urine Culture   CBC with Differential/Platelet   Comprehensive metabolic panel with GFR   Lipid panel   TSH   POCT urinalysis dipstick     Follow-up: Return if symptoms worsen or fail to improve, for [facility].   I,Lauren M Auman,acting as a Neurosurgeon for [employer], [employer].,have documented all relevant documentation on the behalf of Odilia Bennett, PA,as directed by  Odilia Bennett, PA while in the presence of Odilia Bennett, [place].    An After Visit Summary was printed and given to the patient.  Odilia Bennett, [place] [facility] [phone]

## 2023-05-27 ENCOUNTER — Encounter: Payer: Self-pay | Admitting: Physician Assistant

## 2023-05-27 ENCOUNTER — Ambulatory Visit: Payer: Self-pay | Admitting: Physician Assistant

## 2023-05-27 VITALS — BP 120/80 | HR 69 | Temp 97.4°F | Resp 14 | Ht 65.0 in | Wt 228.0 lb

## 2023-05-27 DIAGNOSIS — C642 Malignant neoplasm of left kidney, except renal pelvis: Secondary | ICD-10-CM

## 2023-05-27 DIAGNOSIS — M545 Low back pain, unspecified: Secondary | ICD-10-CM | POA: Insufficient documentation

## 2023-05-27 DIAGNOSIS — E039 Hypothyroidism, unspecified: Secondary | ICD-10-CM

## 2023-05-27 DIAGNOSIS — E782 Mixed hyperlipidemia: Secondary | ICD-10-CM

## 2023-05-27 DIAGNOSIS — I129 Hypertensive chronic kidney disease with stage 1 through stage 4 chronic kidney disease, or unspecified chronic kidney disease: Secondary | ICD-10-CM

## 2023-05-27 DIAGNOSIS — N1831 Chronic kidney disease, stage 3a: Secondary | ICD-10-CM

## 2023-05-27 DIAGNOSIS — N390 Urinary tract infection, site not specified: Secondary | ICD-10-CM

## 2023-05-27 DIAGNOSIS — K219 Gastro-esophageal reflux disease without esophagitis: Secondary | ICD-10-CM

## 2023-05-27 LAB — POCT URINALYSIS DIPSTICK
Bilirubin, UA: NEGATIVE
Blood, UA: NEGATIVE
Glucose, UA: NEGATIVE
Ketones, UA: NEGATIVE
Nitrite, UA: NEGATIVE
Protein, UA: NEGATIVE
Spec Grav, UA: 1.015 (ref 1.010–1.025)
Urobilinogen, UA: 0.2 U/dL
pH, UA: 6 (ref 5.0–8.0)

## 2023-05-27 MED ORDER — LOSARTAN POTASSIUM 100 MG PO TABS: 100.0000 mg | ORAL_TABLET | Freq: Every day | ORAL | 3 refills | Status: DC

## 2023-05-27 MED ORDER — LEVOTHYROXINE SODIUM 100 MCG PO TABS: 100.0000 ug | ORAL_TABLET | Freq: Every day | ORAL | 3 refills | Status: DC

## 2023-05-27 MED ORDER — OMEPRAZOLE 40 MG PO CPDR: 40.0000 mg | DELAYED_RELEASE_CAPSULE | Freq: Every day | ORAL | 3 refills | Status: DC

## 2023-05-27 MED ORDER — FLUCONAZOLE 150 MG PO TABS: 150.0000 mg | ORAL_TABLET | Freq: Once | ORAL | 0 refills | Status: AC

## 2023-05-27 MED ORDER — PRAVASTATIN SODIUM 80 MG PO TABS: ORAL_TABLET | ORAL | 3 refills | Status: DC

## 2023-05-27 MED ORDER — CIPROFLOXACIN HCL 500 MG PO TABS
500.0000 mg | ORAL_TABLET | Freq: Every day | ORAL | 0 refills | Status: AC
Start: 2023-05-27 — End: 2023-06-06

## 2023-05-27 MED ORDER — AMLODIPINE BESYLATE 5 MG PO TABS: 5.0000 mg | ORAL_TABLET | Freq: Every day | ORAL | 1 refills | Status: DC

## 2023-05-27 NOTE — Assessment & Plan Note (Signed)
 Well controlled.  Continue to work on eating a healthy diet and exercise.  Labs drawn today.   No major side effects reported, and no issues with compliance. The current medical regimen is effective;  continue present plan with amlodipine  5mg , Losartan  100mg  Will adjust medication as needed depending on labs BP Readings from Last 3 Encounters:  05/27/23 120/80  04/26/23 137/72  03/14/23 138/86

## 2023-05-27 NOTE — Assessment & Plan Note (Signed)
 Previous kidney cancer with no current evidence of recurrence. Monitoring necessary due to recurrent UTIs. - Monitor renal function and watch for signs of recurrence.

## 2023-05-27 NOTE — Assessment & Plan Note (Signed)
 Positive for UTI Will treat UTI and see if symptoms improve Return for follow up if symptoms continue

## 2023-05-27 NOTE — Assessment & Plan Note (Signed)
 Well controlled.  Continue to work on eating a healthy diet and exercise.  Labs drawn today.   No major side effects reported, and no issues with compliance. The current medical regimen is effective;  continue present plan with Pravastatin 80mg  Will adjust medication as needed depending on labs Lab Results  Component Value Date   LDLCALC 102 (H) 03/14/2023

## 2023-05-27 NOTE — Assessment & Plan Note (Signed)
 Labs drawn today Continue to monitor for changing symptoms Continue taking Synthroid Will adjust treatment depending on resutls

## 2023-05-27 NOTE — Assessment & Plan Note (Signed)
 Recurrent UTIs, 5-6 times annually. Current urinalysis shows elevated WBCs. Risk of kidney involvement if untreated. History of kidney cancer may contribute to recurrence. - Refer to urologist Dr. Quinton Buckler for evaluation. She wants to call to set appointment. Will send referral if needed - Prescribe antibiotic for current UTI, adjust for renal function. - Prescribe two antifungal pills for yeast infection prevention. - Discuss low-dose prophylactic antibiotic for UTI prevention.

## 2023-05-27 NOTE — Patient Instructions (Signed)
 VISIT SUMMARY:  Today, we discussed your recurrent urinary tract infections (UTIs), lower back pain, history of kidney cancer, and the stress you are experiencing as a caregiver. We reviewed your recent urinalysis results and talked about your current medications and future health maintenance.  YOUR PLAN:  -URINARY TRACT INFECTION: You have recurrent urinary tract infections (UTIs), which are infections in your urinary system. We will refer you to a urologist, Dr. Quinton Buckler, for further evaluation. You will be prescribed an antibiotic for your current UTI, adjusted for your kidney function, and two antifungal pills to prevent yeast infections. We also discussed the possibility of a low-dose antibiotic to prevent future UTIs.  -KIDNEY CANCER: You have a history of kidney cancer, which requires ongoing monitoring due to your recurrent UTIs. We will continue to monitor your kidney function and watch for any signs of cancer recurrence.  -CAREGIVER STRESS AND BURNOUT: You are experiencing significant stress and burnout from caring for your disabled sister. We will consult a social worker to explore support services and care options. It is important to communicate with your sister about the impact of caregiving on your well-being. We may also consider legal assistance to help manage caregiving responsibilities.  -GENERAL HEALTH MAINTENANCE: We will perform routine blood work to check your thyroid function, blood count, cholesterol, and A1c levels. We also discussed your Medicare and insurance options for future coverage.  INSTRUCTIONS:  Please follow up with the urologist, Dr. Quinton Buckler, for further evaluation of your recurrent UTIs. Make sure to take the prescribed antibiotic and antifungal pills as directed. Schedule your blood work as discussed. Consider consulting with a social worker for support services and communicate with your sister about the impact of caregiving. We will  continue to monitor your kidney function and overall health.

## 2023-05-27 NOTE — Assessment & Plan Note (Signed)
 Controlled Continue to monitor symptoms Will adjust treatment based on symptoms Continue taking Omeprazole 40mg  as directed.

## 2023-05-28 LAB — CBC WITH DIFFERENTIAL/PLATELET
Basophils Absolute: 0.1 x10E3/uL (ref 0.0–0.2)
Basos: 1 %
EOS (ABSOLUTE): 0.1 x10E3/uL (ref 0.0–0.4)
Eos: 1 %
Hematocrit: 40.2 % (ref 34.0–46.6)
Hemoglobin: 13.2 g/dL (ref 11.1–15.9)
Immature Grans (Abs): 0 x10E3/uL (ref 0.0–0.1)
Immature Granulocytes: 0 %
Lymphocytes Absolute: 2.3 x10E3/uL (ref 0.7–3.1)
Lymphs: 40 %
MCH: 31.4 pg (ref 26.6–33.0)
MCHC: 32.8 g/dL (ref 31.5–35.7)
MCV: 96 fL (ref 79–97)
Monocytes Absolute: 0.5 x10E3/uL (ref 0.1–0.9)
Monocytes: 9 %
Neutrophils Absolute: 2.8 x10E3/uL (ref 1.4–7.0)
Neutrophils: 49 %
Platelets: 282 x10E3/uL (ref 150–450)
RBC: 4.21 x10E6/uL (ref 3.77–5.28)
RDW: 12.9 % (ref 11.7–15.4)
WBC: 5.8 x10E3/uL (ref 3.4–10.8)

## 2023-05-28 LAB — COMPREHENSIVE METABOLIC PANEL WITH GFR
ALT: 22 IU/L (ref 0–32)
AST: 23 IU/L (ref 0–40)
Albumin: 4.4 g/dL (ref 3.9–4.9)
Alkaline Phosphatase: 103 IU/L (ref 44–121)
BUN/Creatinine Ratio: 10 — ABNORMAL LOW (ref 12–28)
BUN: 14 mg/dL (ref 8–27)
Bilirubin Total: 0.5 mg/dL (ref 0.0–1.2)
CO2: 24 mmol/L (ref 20–29)
Calcium: 10 mg/dL (ref 8.7–10.3)
Chloride: 99 mmol/L (ref 96–106)
Creatinine, Ser: 1.46 mg/dL — ABNORMAL HIGH (ref 0.57–1.00)
Globulin, Total: 2.4 g/dL (ref 1.5–4.5)
Glucose: 95 mg/dL (ref 70–99)
Potassium: 4.4 mmol/L (ref 3.5–5.2)
Sodium: 138 mmol/L (ref 134–144)
Total Protein: 6.8 g/dL (ref 6.0–8.5)
eGFR: 40 mL/min/1.73 — ABNORMAL LOW

## 2023-05-28 LAB — LIPID PANEL
Chol/HDL Ratio: 3.8 ratio (ref 0.0–4.4)
Cholesterol, Total: 168 mg/dL (ref 100–199)
HDL: 44 mg/dL
LDL Chol Calc (NIH): 87 mg/dL (ref 0–99)
Triglycerides: 223 mg/dL — ABNORMAL HIGH (ref 0–149)
VLDL Cholesterol Cal: 37 mg/dL (ref 5–40)

## 2023-05-28 LAB — URINE CULTURE

## 2023-05-28 LAB — TSH: TSH: 5.06 u[IU]/mL — ABNORMAL HIGH (ref 0.450–4.500)

## 2023-05-30 ENCOUNTER — Other Ambulatory Visit: Payer: Self-pay | Admitting: Physician Assistant

## 2023-05-30 ENCOUNTER — Encounter: Payer: Self-pay | Admitting: Physician Assistant

## 2023-05-30 DIAGNOSIS — E039 Hypothyroidism, unspecified: Secondary | ICD-10-CM

## 2023-05-30 MED ORDER — LEVOTHYROXINE SODIUM 137 MCG PO TABS: 137.0000 ug | ORAL_TABLET | Freq: Every day | ORAL | 0 refills | Status: DC

## 2023-06-14 ENCOUNTER — Ambulatory Visit: Payer: Self-pay | Admitting: Physician Assistant

## 2023-07-10 ENCOUNTER — Other Ambulatory Visit: Payer: Self-pay

## 2023-07-10 DIAGNOSIS — E039 Hypothyroidism, unspecified: Secondary | ICD-10-CM

## 2023-07-11 ENCOUNTER — Other Ambulatory Visit: Payer: Self-pay

## 2023-07-11 DIAGNOSIS — E039 Hypothyroidism, unspecified: Secondary | ICD-10-CM

## 2023-07-12 LAB — TSH: TSH: 2.24 u[IU]/mL (ref 0.450–4.500)

## 2023-07-13 ENCOUNTER — Ambulatory Visit: Payer: Self-pay | Admitting: Physician Assistant

## 2023-07-20 ENCOUNTER — Other Ambulatory Visit: Payer: Self-pay | Admitting: Physician Assistant

## 2023-07-20 DIAGNOSIS — E039 Hypothyroidism, unspecified: Secondary | ICD-10-CM

## 2023-07-20 MED ORDER — LEVOTHYROXINE SODIUM 137 MCG PO TABS: 137.0000 ug | ORAL_TABLET | Freq: Every day | ORAL | 0 refills | Status: DC

## 2023-07-20 MED ORDER — LEVOTHYROXINE SODIUM 137 MCG PO TABS
137.0000 ug | ORAL_TABLET | Freq: Every day | ORAL | 0 refills | Status: DC
Start: 2023-07-20 — End: 2023-08-31

## 2023-07-20 NOTE — Telephone Encounter (Signed)
 Copied from CRM [id number]. Topic: Clinical - Lab/Test Results >> Jul 20, 2023  1:42 PM Elle L wrote: Reason for CRM: The patient called regarding her lab results and I read the note verbatim and she expressed understanding. However, she advised that she has been out of her levothyroxine  (SYNTHROID ) 137 MCG tablet for a week so I have submitted a refill request.

## 2023-07-20 NOTE — Telephone Encounter (Signed)
 Copied from CRM [id number]. Topic: Clinical - Medication Refill >> Jul 20, 2023  1:41 PM Elle L wrote: Medication: Levothyroxine  (SYNTHROID ) 137 MCG tablet 90 day supply    Has the patient contacted their pharmacy? Yes  This is the patient's preferred pharmacy:  [facility] [place] - [place], [place] - [address] [place] [address] Phone: [phone] Fax: [phone]  Is this the correct pharmacy for this prescription? Yes  Has the prescription been filled recently? Yes  Is the patient out of the medication? Yes  Has the patient been seen for an appointment in the last year OR does the patient have an upcoming appointment? Yes  Can we respond through MyChart? No  Agent: Please be advised that Rx refills may take up to 3 business days. We ask that you follow-up with your pharmacy.

## 2023-07-20 NOTE — Addendum Note (Signed)
 Addended by: ONEITA SLOUGH R on: 07/20/2023 02:41 PM   Modules accepted: Orders

## 2023-07-29 ENCOUNTER — Encounter: Payer: Self-pay | Admitting: Family Medicine

## 2023-07-29 ENCOUNTER — Ambulatory Visit: Payer: Self-pay | Admitting: Family Medicine

## 2023-07-29 ENCOUNTER — Ambulatory Visit: Payer: Self-pay

## 2023-07-29 VITALS — BP 132/72 | HR 68 | Temp 97.8°F | Ht 65.0 in | Wt 226.0 lb

## 2023-07-29 DIAGNOSIS — R300 Dysuria: Secondary | ICD-10-CM | POA: Insufficient documentation

## 2023-07-29 DIAGNOSIS — N390 Urinary tract infection, site not specified: Secondary | ICD-10-CM

## 2023-07-29 LAB — POCT URINALYSIS DIP (CLINITEK)
Bilirubin, UA: NEGATIVE
Glucose, UA: NEGATIVE mg/dL
Ketones, POC UA: NEGATIVE mg/dL
Nitrite, UA: NEGATIVE
POC PROTEIN,UA: NEGATIVE
Spec Grav, UA: 1.015 (ref 1.010–1.025)
Urobilinogen, UA: NEGATIVE U/dL — AB
pH, UA: 6.5 (ref 5.0–8.0)

## 2023-07-29 MED ORDER — CIPROFLOXACIN HCL 500 MG PO TABS: 500.0000 mg | ORAL_TABLET | Freq: Two times a day (BID) | ORAL | 0 refills | Status: AC

## 2023-07-29 MED ORDER — FLUCONAZOLE 150 MG PO TABS: 150.0000 mg | ORAL_TABLET | Freq: Every day | ORAL | 0 refills | Status: AC

## 2023-07-29 NOTE — Assessment & Plan Note (Signed)
 UTI Patient here with symptoms of dysuria started yesterday. UA in the office showed Lab Results  Component Value Date   COLORU yellow 05/27/2023   CLARITYU cloudy 05/27/2023   GLUCOSEUR negative 07/29/2023   BILIRUBINUR negative 07/29/2023   KETONESU negative 05/27/2023   SPECGRAV 1.015 07/29/2023   RBCUR large (A) 07/29/2023   PHUR 6.5 07/29/2023   PROTEINUR Negative 05/27/2023   UROBILINOGEN negative (A) 07/29/2023   LEUKOCYTESUR Moderate (2+) (A) 07/29/2023    She does not appear septic, has no flank tenderness.  Vitals reassuring.   Plan: Symptoms suggestive of urinary tract infection. I have prescribed Cipro  500 mg by mouth TWICE A DAY for 10 days. Your symptoms should gradually improve. Call if the burning worsens, you develop a fever, back pain or pelvic pain or if your symptom do not resolve after completing the antibiotic. Drink plenty of fluids Complete the full course of antibiotics even if the symptoms resolve Remember to wipe from front to back and don't hold it in! If possible, empty your bladder every 4 hours. Advised to watch out for any fever/chills/back pain and to go to the emergency room if she does for evaluation for pyelonephritis and IV fluids and IV antibiotics.

## 2023-07-29 NOTE — Progress Notes (Signed)
 Acute Office Visit  Subjective:    Patient ID: Sandy Beck, female    DOB: 03/26/1960, 63 y.o.   MRN: [id number]  Chief Complaint  Patient presents with   Urinary Tract Infection    Discussed the use of AI scribe software for clinical note transcription with the patient, who gave verbal consent to proceed.  History of Present Illness   The patient, with a history of kidney cancer, presents with a urinary tract infection.  Her urinary tract infection symptoms of dysuria and urgency that began yesterday and are more severe than previous episodes. She has a history of recurrent urinary tract infections, which she attributes to her past kidney cancer. She is unsure if there is hematuria but mentions no recent changes in personal care products. Denies flank pain.   She experiences recurrent yeast infections following antibiotic treatment and typically requires two doses of Diflucan  for management. She tolerates ciprofloxacin  well and has used it in the past for urinary tract infections.      Past Medical History:  Diagnosis Date   Allergy    Arthritis    Cancer (HCC) 2022   left renal cancer, had left nephrectomy   GERD (gastroesophageal reflux disease)    Heart murmur    Hyperlipidemia    Hypertension    Sleep apnea    not currently using cpap   Thyroid disease     Past Surgical History:  Procedure Laterality Date   CHOLECYSTECTOMY, LAPAROSCOPIC  11/03/2022   LAPAROTOMY  1980s   tubal pregnancy, left fallopian tube removal   NEPHRECTOMY Left     Family History  Problem Relation Age of Onset   Alzheimer's disease Mother    Diabetes Mellitus II Mother    Hypertension Father    Stroke Sister    Hypertension Sister    Breast cancer Neg Hx     Social History   Socioeconomic History   Marital status: Married    Spouse name: Ashlan Dignan   Number of children: 1   Years of education: Not on file   Highest education level: Not on file  Occupational History    Occupation: Conservation officer, nature   Occupation: Retired  Tobacco Use   Smoking status: Every Day    Current packs/day: 0.25    Average packs/day: 0.3 packs/day for 45.0 years (11.3 ttl pk-yrs)    Types: Cigarettes   Smokeless tobacco: Never  Vaping Use   Vaping status: Some Days  Substance and Sexual Activity   Alcohol use: Not Currently   Drug use: Never   Sexual activity: Yes    Partners: Male  Other Topics Concern   Not on file  Social History Narrative   Not on file   Social Drivers of Health   Financial Resource Strain: Low Risk  (07/16/2021)   Overall Financial Resource Strain (CARDIA)    Difficulty of Paying Living Expenses: Not hard at all  Food Insecurity: No Food Insecurity (07/16/2021)   Hunger Vital Sign    Worried About Running Out of Food in the Last Year: Never true    Ran Out of Food in the Last Year: Never true  Transportation Needs: No Transportation Needs (07/16/2021)   PRAPARE - Administrator, Civil Service (Medical): No    Lack of Transportation (Non-Medical): No  Physical Activity: Sufficiently Active (07/16/2021)   Exercise Vital Sign    Days of Exercise per Week: 5 days    Minutes of Exercise per Session: 30 min  Stress:  No Stress Concern Present (07/16/2021)   [employer] of Occupational Health - Occupational Stress Questionnaire    Feeling of Stress : Not at all  Social Connections: Moderately Isolated (07/16/2021)   Social Connection and Isolation Panel    Frequency of Communication with Friends and Family: More than three times a week    Frequency of Social Gatherings with Friends and Family: More than three times a week    Attends Religious Services: Never    Database administrator or Organizations: No    Attends Banker Meetings: Never    Marital Status: Married  Catering manager Violence: Not At Risk (07/16/2021)   Humiliation, Afraid, Rape, and Kick questionnaire    Fear of Current or Ex-Partner: No    Emotionally Abused: No     Physically Abused: No    Sexually Abused: No    Outpatient Medications Prior to Visit  Medication Sig Dispense Refill   amLODipine  (NORVASC ) 5 MG tablet Take 1 tablet (5 mg total) by mouth daily. 90 tablet 1   levothyroxine  (SYNTHROID ) 137 MCG tablet Take 1 tablet (137 mcg total) by mouth daily before breakfast. 30 tablet 0   losartan  (COZAAR ) 100 MG tablet Take 1 tablet (100 mg total) by mouth daily. 90 tablet 3   omeprazole  (PRILOSEC) 40 MG capsule Take 1 capsule (40 mg total) by mouth daily. 90 capsule 3   pravastatin  (PRAVACHOL ) 80 MG tablet TAKE 1 TABLET BY MOUTH DAILY 90 tablet 3   traZODone  (DESYREL ) 150 MG tablet Take 1 tablet (150 mg total) by mouth at bedtime. 90 tablet 1   No facility-administered medications prior to visit.    Allergies  Allergen Reactions   Wound Dressing Adhesive Other (See Comments)    Tears skin   Bactrim [Sulfamethoxazole-Trimethoprim]     Nausea and upset stomach   Meloxicam  Other (See Comments)   Sulfa Antibiotics Other (See Comments)   Macrobid  [Nitrofurantoin ] Nausea And Vomiting    Review of Systems  Constitutional:  Negative for appetite change, fatigue and fever.  HENT:  Negative for congestion, ear pain, sinus pressure and sore throat.   Respiratory:  Negative for cough, chest tightness, shortness of breath and wheezing.   Cardiovascular:  Negative for chest pain and palpitations.  Gastrointestinal:  Negative for abdominal pain, constipation, diarrhea, nausea and vomiting.  Genitourinary:  Positive for difficulty urinating, dysuria and urgency. Negative for hematuria.  Musculoskeletal:  Negative for arthralgias, back pain, joint swelling and myalgias.  Skin:  Negative for rash.  Neurological:  Negative for dizziness, weakness and headaches.  Psychiatric/Behavioral:  Negative for dysphoric mood. The patient is not nervous/anxious.        Objective:        07/29/2023   10:37 AM 05/27/2023    8:44 AM 04/26/2023   10:03 AM  Vitals  with BMI  Height 5' 5 5' 5 5' 5  Weight 226 lbs 228 lbs 230 lbs  BMI 37.61 37.94 38.27  Systolic 132 120 862  Diastolic 72 80 72  Pulse 68 69 88    Orthostatic VS for the past 72 hrs (Last 3 readings):  Patient Position BP Location  07/29/23 1037 Sitting Left Arm     Physical Exam Vitals reviewed.  Constitutional:      General: She is not in acute distress.    Appearance: Normal appearance.  Eyes:     Conjunctiva/sclera: Conjunctivae normal.  Cardiovascular:     Rate and Rhythm: Normal rate and regular rhythm.  Heart sounds: Normal heart sounds. No murmur heard. Pulmonary:     Effort: Pulmonary effort is normal.     Breath sounds: Normal breath sounds. No wheezing.  Abdominal:     General: Bowel sounds are normal.     Palpations: Abdomen is soft.     Tenderness: There is no abdominal tenderness.  Musculoskeletal:        General: Normal range of motion.  Skin:    General: Skin is warm.  Neurological:     Mental Status: She is alert. Mental status is at baseline.  Psychiatric:        Mood and Affect: Mood normal.        Behavior: Behavior normal.     Health Maintenance Due  Topic Date Due   Pneumococcal Vaccine 59-34 Years old (1 of 2 - PCV) Never done   Zoster Vaccines- Shingrix (1 of 2) Never done   Colonoscopy  Never done    There are no preventive care reminders to display for this patient.   Lab Results  Component Value Date   TSH 2.240 07/11/2023   Lab Results  Component Value Date   WBC 5.8 05/27/2023   HGB 13.2 05/27/2023   HCT 40.2 05/27/2023   MCV 96 05/27/2023   PLT 282 05/27/2023   Lab Results  Component Value Date   NA 138 05/27/2023   K 4.4 05/27/2023   CO2 24 05/27/2023   GLUCOSE 95 05/27/2023   BUN 14 05/27/2023   CREATININE 1.46 (H) 05/27/2023   BILITOT 0.5 05/27/2023   ALKPHOS 103 05/27/2023   AST 23 05/27/2023   ALT 22 05/27/2023   PROT 6.8 05/27/2023   ALBUMIN 4.4 05/27/2023   CALCIUM 10.0 05/27/2023   EGFR 40 (L)  05/27/2023   Lab Results  Component Value Date   CHOL 168 05/27/2023   Lab Results  Component Value Date   HDL 44 05/27/2023   Lab Results  Component Value Date   LDLCALC 87 05/27/2023   Lab Results  Component Value Date   TRIG 223 (H) 05/27/2023   Lab Results  Component Value Date   CHOLHDL 3.8 05/27/2023   Lab Results  Component Value Date   HGBA1C 6.1 (H) 03/14/2023       Assessment & Plan:  Acute UTI Assessment & Plan: UTI Patient here with symptoms of dysuria started yesterday. UA in the office showed Lab Results  Component Value Date   COLORU yellow 05/27/2023   CLARITYU cloudy 05/27/2023   GLUCOSEUR negative 07/29/2023   BILIRUBINUR negative 07/29/2023   KETONESU negative 05/27/2023   SPECGRAV 1.015 07/29/2023   RBCUR large (A) 07/29/2023   PHUR 6.5 07/29/2023   PROTEINUR Negative 05/27/2023   UROBILINOGEN negative (A) 07/29/2023   LEUKOCYTESUR Moderate (2+) (A) 07/29/2023    She does not appear septic, has no flank tenderness.  Vitals reassuring.   Plan: Symptoms suggestive of urinary tract infection. I have prescribed Cipro  500 mg by mouth TWICE A DAY for 10 days. Your symptoms should gradually improve. Call if the burning worsens, you develop a fever, back pain or pelvic pain or if your symptom do not resolve after completing the antibiotic. Drink plenty of fluids Complete the full course of antibiotics even if the symptoms resolve Remember to wipe from front to back and don't hold it in! If possible, empty your bladder every 4 hours. Advised to watch out for any fever/chills/back pain and to go to the emergency room if she does for evaluation  for pyelonephritis and IV fluids and IV antibiotics.   Orders: -     Ciprofloxacin  HCl; Take 1 tablet (500 mg total) by mouth 2 (two) times daily for 10 days.  Dispense: 20 tablet; Refill: 0 -     Fluconazole ; Take 1 tablet (150 mg total) by mouth daily for 2 doses.  Dispense: 2 tablet; Refill:  0  Dysuria -     POCT URINALYSIS DIP (CLINITEK)     Meds ordered this encounter  Medications   ciprofloxacin  (CIPRO ) 500 MG tablet    Sig: Take 1 tablet (500 mg total) by mouth 2 (two) times daily for 10 days.    Dispense:  20 tablet    Refill:  0   fluconazole  (DIFLUCAN ) 150 MG tablet    Sig: Take 1 tablet (150 mg total) by mouth daily for 2 doses.    Dispense:  2 tablet    Refill:  0    Orders Placed This Encounter  Procedures   POCT URINALYSIS DIP (CLINITEK)     Follow-up: Return if symptoms worsen or fail to improve.  An After Visit Summary was printed and given to the patient.   I,Lauren M Auman,acting as a scribe for Harrie CHRISTELLA Cedar, FNP.,have documented all relevant documentation on the behalf of Harrie CHRISTELLA Cedar, FNP,as directed by  Harrie CHRISTELLA Cedar, FNP while in the presence of Harrie CHRISTELLA Cedar, FNP.    I attest that I have reviewed this visit and agree with the plan scribed by my staff.   Harrie CHRISTELLA Cedar, FNP [facility] [phone]

## 2023-07-29 NOTE — Addendum Note (Signed)
 Addended by: Lem Peary M on: 07/29/2023 12:06 PM   Modules accepted: Orders

## 2023-07-29 NOTE — Telephone Encounter (Signed)
 Appointment scheduled for today

## 2023-07-29 NOTE — Telephone Encounter (Signed)
 FYI Only or Action Required?: Action required by provider: PT requesting abx for uti s/s.  Patient was last seen in primary care on 05/27/2023 by Milon Cleaves, PA.  Called Nurse Triage reporting Dysuria.  Symptoms began yesterday, Pain , burning and frequencyyesterday.  Interventions attempted: Nothing.  Symptoms are: gradually worsening.  Triage Disposition: See Physician Within 24 Hours - Pt refuses OV and wants medication called in.  Patient/caregiver understands and will follow disposition?: No              Copied from CRM [id number]. Topic: Clinical - Red Word Triage >> Jul 29, 2023  7:33 AM Delon HERO wrote: Red Word that prompted transfer to Nurse Triage: Patient is calling vaginal pain and burning that started yesterday. Patient reporting that she only has one kidney. Reason for Disposition  Urinating more frequently than usual (i.e., frequency) OR new-onset of the feeling of an urgent need to urinate (i.e., urgency)  Answer Assessment - Initial Assessment Questions 1. SYMPTOM: What's the main symptom you're concerned about? (e.g., frequency, incontinence)     Frequency, pain, burning  2. ONSET: When did the  s/s  start?     yesterday 3. PAIN: Is there any pain? If Yes, ask: How bad is it? (Scale: 1-10; mild, moderate, severe)     yes 4. CAUSE: What do you think is causing the symptoms?     UTI 5. OTHER SYMPTOMS: Do you have any other symptoms? (e.g., blood in urine, fever, flank pain, pain with urination)     no  Protocols used: Urinary Symptoms-A-AH

## 2023-08-01 ENCOUNTER — Ambulatory Visit: Payer: Self-pay | Admitting: Family Medicine

## 2023-08-01 LAB — URINE CULTURE

## 2023-08-31 ENCOUNTER — Ambulatory Visit (INDEPENDENT_AMBULATORY_CARE_PROVIDER_SITE_OTHER): Payer: Self-pay | Admitting: Physician Assistant

## 2023-08-31 ENCOUNTER — Encounter: Payer: Self-pay | Admitting: Physician Assistant

## 2023-08-31 VITALS — BP 138/72 | HR 79 | Temp 97.4°F | Ht 65.0 in | Wt 227.0 lb

## 2023-08-31 DIAGNOSIS — R0602 Shortness of breath: Secondary | ICD-10-CM | POA: Insufficient documentation

## 2023-08-31 DIAGNOSIS — I129 Hypertensive chronic kidney disease with stage 1 through stage 4 chronic kidney disease, or unspecified chronic kidney disease: Secondary | ICD-10-CM

## 2023-08-31 DIAGNOSIS — E782 Mixed hyperlipidemia: Secondary | ICD-10-CM

## 2023-08-31 DIAGNOSIS — N1832 Chronic kidney disease, stage 3b: Secondary | ICD-10-CM

## 2023-08-31 DIAGNOSIS — G4700 Insomnia, unspecified: Secondary | ICD-10-CM

## 2023-08-31 DIAGNOSIS — N1831 Chronic kidney disease, stage 3a: Secondary | ICD-10-CM

## 2023-08-31 DIAGNOSIS — K219 Gastro-esophageal reflux disease without esophagitis: Secondary | ICD-10-CM

## 2023-08-31 DIAGNOSIS — E039 Hypothyroidism, unspecified: Secondary | ICD-10-CM

## 2023-08-31 MED ORDER — LOSARTAN POTASSIUM 100 MG PO TABS: 100.0000 mg | ORAL_TABLET | Freq: Every day | ORAL | 3 refills | Status: DC

## 2023-08-31 MED ORDER — OMEPRAZOLE 40 MG PO CPDR: 40.0000 mg | DELAYED_RELEASE_CAPSULE | Freq: Every day | ORAL | 3 refills | Status: DC

## 2023-08-31 MED ORDER — LEVOTHYROXINE SODIUM 137 MCG PO TABS: 137.0000 ug | ORAL_TABLET | Freq: Every day | ORAL | 0 refills | Status: DC

## 2023-08-31 MED ORDER — AMLODIPINE BESYLATE 5 MG PO TABS: 5.0000 mg | ORAL_TABLET | Freq: Every day | ORAL | 1 refills | Status: DC

## 2023-08-31 MED ORDER — PRAVASTATIN SODIUM 80 MG PO TABS: ORAL_TABLET | ORAL | 3 refills | Status: DC

## 2023-08-31 MED ORDER — TRAZODONE HCL 150 MG PO TABS
150.0000 mg | ORAL_TABLET | Freq: Every day | ORAL | 1 refills | Status: DC
Start: 2023-08-31 — End: 2023-12-02

## 2023-08-31 NOTE — Assessment & Plan Note (Signed)
 Controlled Continue to monitor symptoms Will adjust treatment based on symptoms Continue taking Omeprazole  40mg  as directed.

## 2023-08-31 NOTE — Assessment & Plan Note (Signed)
 Chronic kidney disease with one kidney. Monitoring kidney function is crucial, especially with Lasix use. - Monitor kidney function through lab tests. - Plan nephrology consultation in December when Medicare is active.

## 2023-08-31 NOTE — Assessment & Plan Note (Signed)
Controlled Denies any major side effects or symptoms Continue taking Trazodone 150mg  as prescribed

## 2023-08-31 NOTE — Progress Notes (Signed)
 Subjective:  Patient ID: Sandy Beck, female    DOB: 05-19-1960  Age: 63 y.o. MRN: [id number]  Chief Complaint  Patient presents with   Medical Management of Chronic Issues    HPI:  Discussed the use of AI scribe software for clinical note transcription with the patient, who gave verbal consent to proceed.  History of Present Illness   Sandy Beck is a 63 year old female who presents with dizziness and urinary issues.  She experiences dizziness, particularly when changing positions. She denies cough, shortness of breath, and palpitations. She reports that she does not experience shortness of breath while walking through grocery store aisles.  She has been taking Lasix 40 mg for nearly three months, which has increased her urination frequency from once a day to three or four times a day. She drinks water regularly but does not consume electrolyte drinks.  She has a history of urinary tract infections (UTIs) and recently experienced a severe UTI, which was treated with ciprofloxacin . Typically, she does not show symptoms of UTIs, but this time she did. She is concerned about the frequency of her UTIs. She uses Zadrosil wash for personal hygiene.  She has one kidney and is concerned about her kidney function, especially in relation to her dizziness and urinary issues. She does not have insurance until December and is considering options for managing her healthcare costs, including cash pay for medications and potential treatments.  She smokes cigarettes, typically one per day at home and up to four when not at home.          08/31/2023    9:13 AM 03/14/2023    9:03 AM 09/01/2022    8:46 AM 05/27/2022    8:57 AM 01/28/2022    7:32 AM  Depression screen PHQ 2/9  Decreased Interest 1 1 0 1 0  Down, Depressed, Hopeless 1 0 0 0 0  PHQ - 2 Score 2 1 0 1 0  Altered sleeping 1 1 1 1    Tired, decreased energy 1 1 3 2    Change in appetite 1 1 3  0   Feeling bad or failure about yourself   0 0 0 0   Trouble concentrating 0 0 0 0   Moving slowly or fidgety/restless 0 0 0 0   Suicidal thoughts 0 0 0 0   PHQ-9 Score 5 4 7 4    Difficult doing work/chores Somewhat difficult Not difficult at all Somewhat difficult Not difficult at all         08/31/2023    9:13 AM  Fall Risk   Falls in the past year? 0  Number falls in past yr: 0  Injury with Fall? 0  Risk for fall due to : No Fall Risks  Follow up Falls evaluation completed    Patient Care Team: Milon Cleaves, [place] as PCP - General (Physician Assistant) Lupe Charlie DEL, MD (Urology)   Review of Systems  Constitutional:  Negative for appetite change, fatigue and fever.  HENT:  Negative for congestion, ear pain, sinus pressure and sore throat.   Respiratory:  Negative for cough, chest tightness, shortness of breath and wheezing.   Cardiovascular:  Negative for chest pain and palpitations.  Gastrointestinal:  Negative for abdominal pain, constipation, diarrhea, nausea and vomiting.  Genitourinary:  Negative for dysuria and hematuria.  Musculoskeletal:  Negative for arthralgias, back pain, joint swelling and myalgias.  Skin:  Negative for rash.  Neurological:  Positive for dizziness. Negative for weakness and headaches.  Psychiatric/Behavioral:  Positive for dysphoric mood. The patient is not nervous/anxious.     No current outpatient medications on file prior to visit.   No current facility-administered medications on file prior to visit.   Past Medical History:  Diagnosis Date   Allergy    Arthritis    Cancer (HCC) 2022   left renal cancer, had left nephrectomy   GERD (gastroesophageal reflux disease)    Heart murmur    Hyperlipidemia    Hypertension    Sleep apnea    not currently using cpap   Thyroid disease    Past Surgical History:  Procedure Laterality Date   CHOLECYSTECTOMY, LAPAROSCOPIC  11/03/2022   LAPAROTOMY  1980s   tubal pregnancy, left fallopian tube removal   NEPHRECTOMY Left     Family  History  Problem Relation Age of Onset   Alzheimer's disease Mother    Diabetes Mellitus II Mother    Hypertension Father    Stroke Sister    Hypertension Sister    Breast cancer Neg Hx    Social History   Socioeconomic History   Marital status: Married    Spouse name: Grisela Mesch   Number of children: 1   Years of education: Not on file   Highest education level: Not on file  Occupational History   Occupation: Conservation officer, nature   Occupation: Retired  Tobacco Use   Smoking status: Every Day    Current packs/day: 0.25    Average packs/day: 0.3 packs/day for 45.0 years (11.3 ttl pk-yrs)    Types: Cigarettes   Smokeless tobacco: Never  Vaping Use   Vaping status: Some Days  Substance and Sexual Activity   Alcohol use: Not Currently   Drug use: Never   Sexual activity: Yes    Partners: Male  Other Topics Concern   Not on file  Social History Narrative   Not on file   Social Drivers of Health   Financial Resource Strain: Low Risk  (07/16/2021)   Overall Financial Resource Strain (CARDIA)    Difficulty of Paying Living Expenses: Not hard at all  Food Insecurity: No Food Insecurity (07/16/2021)   Hunger Vital Sign    Worried About Running Out of Food in the Last Year: Never true    Ran Out of Food in the Last Year: Never true  Transportation Needs: No Transportation Needs (07/16/2021)   PRAPARE - Administrator, Civil Service (Medical): No    Lack of Transportation (Non-Medical): No  Physical Activity: Sufficiently Active (07/16/2021)   Exercise Vital Sign    Days of Exercise per Week: 5 days    Minutes of Exercise per Session: 30 min  Stress: No Stress Concern Present (07/16/2021)   [employer] of Occupational Health - Occupational Stress Questionnaire    Feeling of Stress : Not at all  Social Connections: Moderately Isolated (07/16/2021)   Social Connection and Isolation Panel    Frequency of Communication with Friends and Family: More than three times a week     Frequency of Social Gatherings with Friends and Family: More than three times a week    Attends Religious Services: Never    Database administrator or Organizations: No    Attends Engineer, structural: Never    Marital Status: Married    Objective:  BP 138/72 (BP Location: Right Arm, Patient Position: Sitting)   Pulse 79   Temp (!) 97.4 F (36.3 C) (Temporal)   Ht 5' 5 (1.651 m)  Wt 227 lb (103 kg)   LMP  (LMP Unknown)   SpO2 97%   BMI 37.77 kg/m      08/31/2023    9:10 AM 07/29/2023   10:37 AM 05/27/2023    8:44 AM  BP/Weight  Systolic BP 138 132 120  Diastolic BP 72 72 80  Wt. (Lbs) 227 226 228  BMI 37.77 kg/m2 37.61 kg/m2 37.94 kg/m2    Physical Exam Vitals reviewed.  Constitutional:      Appearance: Normal appearance.  Cardiovascular:     Rate and Rhythm: Normal rate and regular rhythm.     Heart sounds: Normal heart sounds.  Pulmonary:     Effort: Pulmonary effort is normal.     Breath sounds: Normal breath sounds.  Abdominal:     General: Bowel sounds are normal.     Palpations: Abdomen is soft.     Tenderness: There is no abdominal tenderness.  Neurological:     Mental Status: She is alert and oriented to person, place, and time.  Psychiatric:        Mood and Affect: Mood normal.        Behavior: Behavior normal.       Lab Results  Component Value Date   WBC 5.8 05/27/2023   HGB 13.2 05/27/2023   HCT 40.2 05/27/2023   PLT 282 05/27/2023   GLUCOSE 95 05/27/2023   CHOL 168 05/27/2023   TRIG 223 (H) 05/27/2023   HDL 44 05/27/2023   LDLCALC 87 05/27/2023   ALT 22 05/27/2023   AST 23 05/27/2023   NA 138 05/27/2023   K 4.4 05/27/2023   CL 99 05/27/2023   CREATININE 1.46 (H) 05/27/2023   BUN 14 05/27/2023   CO2 24 05/27/2023   TSH 2.240 07/11/2023   HGBA1C 6.1 (H) 03/14/2023      Assessment & Plan:  SOB (shortness of breath) Assessment & Plan: Dizziness with differential including dehydration, cardiac issues, or renal  function affecting CO2 retention. Lasix use may contribute to dehydration and electrolyte imbalance. Smoking may also contribute. - Order chest x-ray to assess lung capacity and rule out pulmonary issues. - Review lab results for electrolytes and kidney function. - Consider pulmonologist referral for pulmonary function test if chest x-ray is normal. - Advise on Liquid IV for electrolyte supplementation if labs indicate imbalance. - Discuss potential potassium supplementation if Lasix is continued.  Orders: -     CBC with Differential/Platelet -     DG Chest 2 View; Future  Benign hypertension with stage 3a chronic kidney disease (HCC) Assessment & Plan: Well controlled.  Continue to work on eating a healthy diet and exercise.  Labs drawn today.   No major side effects reported, and no issues with compliance. The current medical regimen is effective;  continue present plan with amlodipine  5mg , Losartan  100mg  Will adjust medication as needed depending on labs BP Readings from Last 3 Encounters:  08/31/23 138/72  07/29/23 132/72  05/27/23 120/80     Orders: -     amLODIPine  Besylate; Take 1 tablet (5 mg total) by mouth daily.  Dispense: 90 tablet; Refill: 1 -     Losartan  Potassium; Take 1 tablet (100 mg total) by mouth daily.  Dispense: 90 tablet; Refill: 3 -     Comprehensive metabolic panel with GFR -     Hemoglobin A1c  Gastroesophageal reflux disease, unspecified whether esophagitis present Assessment & Plan: Controlled Continue to monitor symptoms Will adjust treatment based on symptoms Continue taking  Omeprazole  40mg  as directed.  Orders: -     Omeprazole ; Take 1 capsule (40 mg total) by mouth daily.  Dispense: 90 capsule; Refill: 3  Insomnia, unspecified type Assessment & Plan: Controlled Denies any major side effects or symptoms Continue taking Trazodone  150mg  as prescribed  Orders: -     traZODone  HCl; Take 1 tablet (150 mg total) by mouth at bedtime.   Dispense: 90 tablet; Refill: 1  Acquired hypothyroidism Assessment & Plan: Labs drawn today Continue to monitor for changing symptoms Continue taking Synthroid  100mcg Will adjust treatment depending on resutls  Orders: -     Levothyroxine  Sodium; Take 1 tablet (137 mcg total) by mouth daily before breakfast.  Dispense: 30 tablet; Refill: 0 -     TSH  Mixed hyperlipidemia Assessment & Plan: Well controlled.  Continue to work on eating a healthy diet and exercise.  Labs drawn today.   No major side effects reported, and no issues with compliance. The current medical regimen is effective;  continue present plan with Pravastatin  80mg  Will adjust medication as needed depending on labs Lab Results  Component Value Date   LDLCALC 87 05/27/2023     Orders: -     Pravastatin  Sodium; TAKE 1 TABLET BY MOUTH DAILY  Dispense: 90 tablet; Refill: 3 -     Lipid panel  CKD stage 3b, GFR 30-44 ml/min (HCC) Assessment & Plan: Chronic kidney disease with one kidney. Monitoring kidney function is crucial, especially with Lasix use. - Monitor kidney function through lab tests. - Plan nephrology consultation in December when Medicare is active.     Tobacco use disorder Continues to smoke cigarettes, one per day at home and up to four when not at home. Smoking cessation is advised to improve overall health and potentially reduce dizziness.      Meds ordered this encounter  Medications   amLODipine  (NORVASC ) 5 MG tablet    Sig: Take 1 tablet (5 mg total) by mouth daily.    Dispense:  90 tablet    Refill:  1   omeprazole  (PRILOSEC) 40 MG capsule    Sig: Take 1 capsule (40 mg total) by mouth daily.    Dispense:  90 capsule    Refill:  3    Requesting 1 year supply   traZODone  (DESYREL ) 150 MG tablet    Sig: Take 1 tablet (150 mg total) by mouth at bedtime.    Dispense:  90 tablet    Refill:  1   levothyroxine  (SYNTHROID ) 137 MCG tablet    Sig: Take 1 tablet (137 mcg total) by mouth  daily before breakfast.    Dispense:  30 tablet    Refill:  0   losartan  (COZAAR ) 100 MG tablet    Sig: Take 1 tablet (100 mg total) by mouth daily.    Dispense:  90 tablet    Refill:  3    Please send a replace/new response with 90-Day Supply if appropriate to maximize member benefit. Requesting 1 year supply.   pravastatin  (PRAVACHOL ) 80 MG tablet    Sig: TAKE 1 TABLET BY MOUTH DAILY    Dispense:  90 tablet    Refill:  3    Please send a replace/new response with 90-Day Supply if appropriate to maximize member benefit. Requesting 1 year supply.    Orders Placed This Encounter  Procedures   DG Chest 2 View   CBC with Differential/Platelet   Comprehensive metabolic panel with GFR   Hemoglobin A1c  Lipid panel   TSH     Follow-up: No follow-ups on file.   I,Lauren M Auman,acting as a Neurosurgeon for [employer], PA.,have documented all relevant documentation on the behalf of Nola Angles, PA,as directed by  Nola Angles, PA while in the presence of Nola Angles, [place].   An After Visit Summary was printed and given to the patient.  Nola Angles, [place] [facility] [phone]

## 2023-08-31 NOTE — Assessment & Plan Note (Signed)
 Well controlled.  Continue to work on eating a healthy diet and exercise.  Labs drawn today.   No major side effects reported, and no issues with compliance. The current medical regimen is effective;  continue present plan with Pravastatin  80mg  Will adjust medication as needed depending on labs Lab Results  Component Value Date   LDLCALC 87 05/27/2023

## 2023-08-31 NOTE — Patient Instructions (Signed)
 VISIT SUMMARY:  Today, we discussed your dizziness and urinary issues. You mentioned experiencing dizziness, especially when changing positions, and an increase in urination frequency since starting Lasix. We also reviewed your history of urinary tract infections and concerns about your kidney function, given that you have one kidney. Additionally, we talked about your smoking habits and potential impacts on your health.  YOUR PLAN:  -DIZZINESS: We will check your lung capacity with a chest x-ray and review your lab results for electrolytes and kidney function. If needed, we may refer you to a pulmonologist for further tests. Consider using Liquid IV for electrolyte supplementation and discuss potassium supplementation if you continue taking Lasix.  -CHRONIC KIDNEY DISEASE STATUS POST NEPHRECTOMY (ONE KIDNEY): Chronic kidney disease means your kidney function is reduced. Since you have one kidney, it's important to monitor its function, especially while taking Lasix. We will keep an eye on your kidney function through lab tests and plan a consultation with a nephrologist in December when your Medicare is active.  -RECURRENT URINARY TRACT INFECTIONS: Recurrent urinary tract infections mean you frequently get infections in your urinary system. To reduce the risk, try to urinate at least three times a day. We will also follow up with a nephrologist to monitor your kidney function and UTI risk.  -TOBACCO USE DISORDER: Tobacco use disorder means you are dependent on smoking cigarettes. Quitting smoking is advised to improve your overall health and may help reduce your dizziness.  INSTRUCTIONS:  Please follow up with the recommended chest x-ray and lab tests for electrolytes and kidney function. Consider using Liquid IV for electrolyte supplementation if needed. We will plan a nephrology consultation in December when your Medicare is active. Try to urinate at least three times a day to reduce UTI risk and  consider quitting smoking to improve your overall health.

## 2023-08-31 NOTE — Assessment & Plan Note (Signed)
 Well controlled.  Continue to work on eating a healthy diet and exercise.  Labs drawn today.   No major side effects reported, and no issues with compliance. The current medical regimen is effective;  continue present plan with amlodipine  5mg , Losartan  100mg  Will adjust medication as needed depending on labs BP Readings from Last 3 Encounters:  08/31/23 138/72  07/29/23 132/72  05/27/23 120/80

## 2023-08-31 NOTE — Assessment & Plan Note (Signed)
 Labs drawn today Continue to monitor for changing symptoms Continue taking Synthroid Will adjust treatment depending on resutls

## 2023-08-31 NOTE — Assessment & Plan Note (Signed)
 Dizziness with differential including dehydration, cardiac issues, or renal function affecting CO2 retention. Lasix use may contribute to dehydration and electrolyte imbalance. Smoking may also contribute. - Order chest x-ray to assess lung capacity and rule out pulmonary issues. - Review lab results for electrolytes and kidney function. - Consider pulmonologist referral for pulmonary function test if chest x-ray is normal. - Advise on Liquid IV for electrolyte supplementation if labs indicate imbalance. - Discuss potential potassium supplementation if Lasix is continued.

## 2023-09-01 LAB — CBC WITH DIFFERENTIAL/PLATELET
Basophils Absolute: 0.1 x10E3/uL (ref 0.0–0.2)
Basos: 1 %
EOS (ABSOLUTE): 0.1 x10E3/uL (ref 0.0–0.4)
Eos: 1 %
Hematocrit: 41.3 % (ref 34.0–46.6)
Hemoglobin: 13.2 g/dL (ref 11.1–15.9)
Immature Grans (Abs): 0 x10E3/uL (ref 0.0–0.1)
Immature Granulocytes: 0 %
Lymphocytes Absolute: 2.6 x10E3/uL (ref 0.7–3.1)
Lymphs: 33 %
MCH: 31.1 pg (ref 26.6–33.0)
MCHC: 32 g/dL (ref 31.5–35.7)
MCV: 97 fL (ref 79–97)
Monocytes Absolute: 0.7 x10E3/uL (ref 0.1–0.9)
Monocytes: 8 %
Neutrophils Absolute: 4.4 x10E3/uL (ref 1.4–7.0)
Neutrophils: 57 %
Platelets: 271 x10E3/uL (ref 150–450)
RBC: 4.24 x10E6/uL (ref 3.77–5.28)
RDW: 12.5 % (ref 11.7–15.4)
WBC: 7.9 x10E3/uL (ref 3.4–10.8)

## 2023-09-01 LAB — COMPREHENSIVE METABOLIC PANEL WITH GFR
ALT: 24 IU/L (ref 0–32)
AST: 26 IU/L (ref 0–40)
Albumin: 4.4 g/dL (ref 3.9–4.9)
Alkaline Phosphatase: 99 IU/L (ref 44–121)
BUN/Creatinine Ratio: 12 (ref 12–28)
BUN: 17 mg/dL (ref 8–27)
Bilirubin Total: 0.4 mg/dL (ref 0.0–1.2)
CO2: 25 mmol/L (ref 20–29)
Calcium: 9.9 mg/dL (ref 8.7–10.3)
Chloride: 99 mmol/L (ref 96–106)
Creatinine, Ser: 1.45 mg/dL — ABNORMAL HIGH (ref 0.57–1.00)
Globulin, Total: 2.4 g/dL (ref 1.5–4.5)
Glucose: 93 mg/dL (ref 70–99)
Potassium: 4.9 mmol/L (ref 3.5–5.2)
Sodium: 138 mmol/L (ref 134–144)
Total Protein: 6.8 g/dL (ref 6.0–8.5)
eGFR: 40 mL/min/1.73 — ABNORMAL LOW (ref >59)

## 2023-09-01 LAB — LIPID PANEL
Chol/HDL Ratio: 3.8 ratio (ref 0.0–4.4)
Cholesterol, Total: 173 mg/dL (ref 100–199)
HDL: 45 mg/dL (ref >39)
LDL Chol Calc (NIH): 93 mg/dL (ref 0–99)
Triglycerides: 206 mg/dL — ABNORMAL HIGH (ref 0–149)
VLDL Cholesterol Cal: 35 mg/dL (ref 5–40)

## 2023-09-01 LAB — TSH: TSH: 2.16 u[IU]/mL (ref 0.450–4.500)

## 2023-09-01 LAB — HEMOGLOBIN A1C
Est. average glucose Bld gHb Est-mCnc: 123 mg/dL
Hgb A1c MFr Bld: 5.9 % — ABNORMAL HIGH (ref 4.8–5.6)

## 2023-09-04 ENCOUNTER — Ambulatory Visit: Payer: Self-pay | Admitting: Physician Assistant

## 2023-09-06 ENCOUNTER — Other Ambulatory Visit: Payer: Self-pay | Admitting: Physician Assistant

## 2023-09-07 ENCOUNTER — Ambulatory Visit (INDEPENDENT_AMBULATORY_CARE_PROVIDER_SITE_OTHER)
Admission: RE | Admit: 2023-09-07 | Discharge: 2023-09-07 | Disposition: A | Discharge status: Home / Self Care | Payer: Self-pay | Source: Ambulatory Visit | Attending: Physician Assistant | Admitting: Physician Assistant

## 2023-09-07 DIAGNOSIS — R0602 Shortness of breath: Secondary | ICD-10-CM

## 2023-09-07 DIAGNOSIS — R42 Dizziness and giddiness: Secondary | ICD-10-CM

## 2023-09-08 ENCOUNTER — Other Ambulatory Visit: Payer: Self-pay | Admitting: Physician Assistant

## 2023-09-08 DIAGNOSIS — R600 Localized edema: Secondary | ICD-10-CM

## 2023-09-08 MED ORDER — SPIRONOLACTONE 25 MG PO TABS: 12.5000 mg | ORAL_TABLET | Freq: Every day | ORAL | 1 refills | Status: AC

## 2023-10-26 ENCOUNTER — Other Ambulatory Visit: Payer: Self-pay | Admitting: Physician Assistant

## 2023-10-26 ENCOUNTER — Telehealth: Payer: Self-pay

## 2023-10-26 DIAGNOSIS — E039 Hypothyroidism, unspecified: Secondary | ICD-10-CM

## 2023-10-26 MED ORDER — LEVOTHYROXINE SODIUM 137 MCG PO TABS: 137.0000 ug | ORAL_TABLET | Freq: Every day | ORAL | 0 refills | Status: DC

## 2023-10-26 NOTE — Telephone Encounter (Signed)
 Copied from CRM #[id number]. Topic: Clinical - Medication Refill >> Oct 26, 2023  8:13 AM [name] wrote: Medication: levothyroxine  (SYNTHROID ) 137 MCG tablet [[id number]] requesting a 90 day supply  Has the patient contacted their pharmacy? No (Agent: If no, request that the patient contact the pharmacy for the refill. If patient does not wish to contact the pharmacy document the reason why and proceed with request.) (Agent: If yes, when and what did the pharmacy advise?)  This is the patient's preferred pharmacy:  [facility] - [place] - [place], [place] - [address] [place] [address] Phone: [phone] Fax: [phone]  Is this the correct pharmacy for this prescription? Yes If no, delete pharmacy and type the correct one.   Has the prescription been filled recently? No  Is the patient out of the medication? Yes 1 left   Has the patient been seen for an appointment in the last year OR does the patient have an upcoming appointment? Yes  Can we respond through MyChart? Yes  Agent: Please be advised that Rx refills may take up to 3 business days. We ask that you follow-up with your pharmacy.

## 2023-10-26 NOTE — Telephone Encounter (Signed)
 Called patient left message for patient to call office back  Copied from CRM #[id number]. Topic: General - Other >> Oct 25, 2023 10:42 AM Joesph NOVAK wrote: Reason for CRM: patient calling to speak to [facility] nurse. Asking for a follow up call. [phone]

## 2023-12-02 ENCOUNTER — Ambulatory Visit (INDEPENDENT_AMBULATORY_CARE_PROVIDER_SITE_OTHER): Payer: Self-pay

## 2023-12-02 VITALS — BP 136/78 | HR 69 | Temp 97.8°F | Ht 65.0 in | Wt 230.8 lb

## 2023-12-02 DIAGNOSIS — E039 Hypothyroidism, unspecified: Secondary | ICD-10-CM

## 2023-12-02 DIAGNOSIS — E782 Mixed hyperlipidemia: Secondary | ICD-10-CM

## 2023-12-02 DIAGNOSIS — N1831 Chronic kidney disease, stage 3a: Secondary | ICD-10-CM

## 2023-12-02 DIAGNOSIS — K219 Gastro-esophageal reflux disease without esophagitis: Secondary | ICD-10-CM

## 2023-12-02 DIAGNOSIS — R300 Dysuria: Secondary | ICD-10-CM

## 2023-12-02 DIAGNOSIS — R7303 Prediabetes: Secondary | ICD-10-CM

## 2023-12-02 DIAGNOSIS — G4700 Insomnia, unspecified: Secondary | ICD-10-CM

## 2023-12-02 DIAGNOSIS — I129 Hypertensive chronic kidney disease with stage 1 through stage 4 chronic kidney disease, or unspecified chronic kidney disease: Secondary | ICD-10-CM

## 2023-12-02 LAB — POCT URINALYSIS DIP (CLINITEK)
Bilirubin, UA: NEGATIVE
Blood, UA: NEGATIVE
Glucose, UA: NEGATIVE mg/dL
Ketones, POC UA: NEGATIVE mg/dL
Nitrite, UA: NEGATIVE
POC PROTEIN,UA: NEGATIVE
Spec Grav, UA: 1.025 (ref 1.010–1.025)
Urobilinogen, UA: 0.2 U/dL
pH, UA: 6 (ref 5.0–8.0)

## 2023-12-02 MED ORDER — LOSARTAN POTASSIUM 100 MG PO TABS: 100.0000 mg | ORAL_TABLET | Freq: Every day | ORAL | 0 refills | Status: AC

## 2023-12-02 MED ORDER — LEVOTHYROXINE SODIUM 137 MCG PO TABS: 137.0000 ug | ORAL_TABLET | Freq: Every day | ORAL | 0 refills | Status: AC

## 2023-12-02 MED ORDER — PRAVASTATIN SODIUM 80 MG PO TABS: ORAL_TABLET | ORAL | 0 refills | Status: AC

## 2023-12-02 MED ORDER — AMLODIPINE BESYLATE 5 MG PO TABS: 5.0000 mg | ORAL_TABLET | Freq: Every day | ORAL | 0 refills | Status: AC

## 2023-12-02 MED ORDER — OMEPRAZOLE 40 MG PO CPDR: 40.0000 mg | DELAYED_RELEASE_CAPSULE | Freq: Every day | ORAL | 0 refills | Status: AC

## 2023-12-02 MED ORDER — TRAZODONE HCL 150 MG PO TABS: 150.0000 mg | ORAL_TABLET | Freq: Every day | ORAL | 0 refills | Status: AC

## 2023-12-02 NOTE — Assessment & Plan Note (Addendum)
 Insomnia Chronic insomnia managed with trazodone  150 mg nightly. - Continue trazodone  150 mg nightly. Orders:   traZODone  (DESYREL ) 150 MG tablet; Take 1 tablet (150 mg total) by mouth at bedtime.

## 2023-12-02 NOTE — Assessment & Plan Note (Addendum)
 Dysuria Urinalysis shows 15 white blood cells, but no symptoms of infection. Differential includes possible contamination or early infection. - Ordered urine culture. - Advised to maintain adequate hydration.

## 2023-12-02 NOTE — Assessment & Plan Note (Addendum)
 Gastroesophageal reflux disease Managed with Prilosec 40 mg as needed, primarily after consuming certain foods like spaghetti. - Continue Prilosec 40 mg as needed. Orders:   omeprazole  (PRILOSEC) 40 MG capsule; Take 1 capsule (40 mg total) by mouth daily.   CBC with Differential/Platelet

## 2023-12-02 NOTE — Assessment & Plan Note (Addendum)
 Hypertensive chronic kidney disease Blood pressure is well-controlled at 136/78 mmHg with current medication regimen. - Continue amlodipine  5 mg daily, losartan  100 mg daily, spironolactone  25 mg daily. Orders:   amLODipine  (NORVASC ) 5 MG tablet; Take 1 tablet (5 mg total) by mouth daily.   losartan  (COZAAR ) 100 MG tablet; Take 1 tablet (100 mg total) by mouth daily.   Hemoglobin A1c   Comprehensive metabolic panel with GFR   CBC with Differential/Platelet

## 2023-12-02 NOTE — Assessment & Plan Note (Addendum)
 Prediabetes Discussion about potential future use of Ozempic  or Wegovy once insurance is obtained. - Ordered A1c level.

## 2023-12-02 NOTE — Assessment & Plan Note (Addendum)
 Hypothyroidism Managed with levothyroxine  137 mcg daily. - Continue levothyroxine  137 mcg daily. - Ordered TSH level. Orders:   levothyroxine  (SYNTHROID ) 137 MCG tablet; Take 1 tablet (137 mcg total) by mouth daily before breakfast.   TSH   CBC with Differential/Platelet

## 2023-12-02 NOTE — Patient Instructions (Signed)
  VISIT SUMMARY: Today, you came in for a routine follow-up and medication refills. We discussed your current health issues, including hypertension, thyroid dysfunction, dyslipidemia, gastroesophageal reflux, prediabetes, and your history of kidney cancer. We also talked about your smoking habits and plans for future screenings.  YOUR PLAN: HYPERTENSION AND EDEMA: Your blood pressure is well-controlled with your current medications, and you are monitoring it at home. You have some swelling in your feet, which is being managed by your podiatrist. -Continue taking amlodipine  5 mg daily, losartan  100 mg daily, and spironolactone  25 mg daily.  THYROID DYSFUNCTION: Your thyroid condition is managed with levothyroxine . -Continue taking levothyroxine  137 mcg daily. -A TSH level has been ordered to monitor your thyroid function.  DYSLIPIDEMIA: Your cholesterol levels are managed with pravastatin . -Continue taking pravastatin  80 mg daily. -A lipid panel has been ordered to check your cholesterol levels.  GASTROESOPHAGEAL REFLUX DISEASE: You experience heartburn, especially after eating certain foods like spaghetti. -Continue taking Prilosec 40 mg as needed.  PREDIABETES: You have a history of prediabetes and are interested in medications like Ozempic  or Wegovy once your insurance is active. -An A1c level has been ordered to monitor your blood sugar levels.  URINARY ABNORMALITIES: Your urinalysis showed some white blood cells, but you have no symptoms of a urinary infection. -A urine culture has been ordered to check for any bacterial infection. -Maintain adequate hydration.  HISTORY OF KIDNEY CANCER: You had your left kidney removed about five years ago due to cancer. -Plan to consult with a nephrologist once your insurance is active.  TOBACCO USE: You have reduced your smoking to 1-4 cigarettes per day from half a pack per day. -Plan for a lung cancer screening CT scan once your insurance is  active.  GENERAL HEALTH MAINTENANCE: We discussed upcoming screenings and preventive health measures. -Plan for a mammogram and colon cancer screening once your insurance is active. -Plan for a Pap smear once your insurance is active.                      Contains text generated by [employer].                                 Contains text generated by [employer]bridge.

## 2023-12-02 NOTE — Progress Notes (Signed)
 Subjective:  Patient ID: Sandy Beck, female    DOB: 02/02/1960  Age: 63 y.o. MRN: [id number]  Chief Complaint  Patient presents with   Medical Management of Chronic Issues    HPI: Discussed the use of AI scribe software for clinical note transcription with the patient, who gave verbal consent to proceed.  Discussed the use of AI scribe software for clinical note transcription with the patient, who gave verbal consent to proceed.  History of Present Illness   Sandy Beck is a 63 year old female who presents for routine follow-up and medication refills.  Hypertension and edema - Hypertension managed with amlodipine  5 mg daily, losartan  100 mg daily, and spironolactone  25 mg daily - Home blood pressure monitoring with satisfactory results - Swelling in feet present; under care of a podiatrist  Thyroid dysfunction - Levothyroxine  137 mcg daily for thyroid management  Dyslipidemia - Pravastatin  80 mg daily for cholesterol management  Gastroesophageal reflux symptoms - Prilosec 40 mg as needed for heartburn, especially after consuming foods like spaghetti  Urinary abnormalities - No urinary symptoms - Urinalysis with 15 white blood cells - Willing to pay for urine culture to confirm presence of bacteria  History of renal neoplasm - History of left kidney cancer, treated with left nephrectomy five years ago  Prediabetes and weight management - History of prediabetes - Interest in medications such as Ozempic  or Wegovy once insurance is active due to current high cost without coverage  Tobacco use and lung cancer screening - Smokes one cigarette daily at home and up to three or four when out, reduced from previous half a pack per day - Considering lung cancer screening once insurance is active  Gynecologic screening and hpv - Pap smear in 2023 showed positive HPV and negative Pap test - Attributes HPV to a past relationship; divorced for 18 years  Sleep disturbance -  Trazodone  150 mg nightly for sleep - No issues with depression or falls  Exertional dyspnea - Shortness of breath with exertion  Appetite and dietary habits - Mindful of diet - Decreased appetite and early satiety            12/02/2023    9:49 AM 08/31/2023    9:13 AM 03/14/2023    9:03 AM 09/01/2022    8:46 AM 05/27/2022    8:57 AM  Depression screen PHQ 2/9  Decreased Interest 0 1 1 0 1  Down, Depressed, Hopeless 0 1 0 0 0  PHQ - 2 Score 0 2 1 0 1  Altered sleeping  1 1 1 1   Tired, decreased energy  1 1 3 2   Change in appetite  1 1 3  0  Feeling bad or failure about yourself   0 0 0 0  Trouble concentrating  0 0 0 0  Moving slowly or fidgety/restless  0 0 0 0  Suicidal thoughts  0 0 0 0  PHQ-9 Score  5  4  7  4    Difficult doing work/chores  Somewhat difficult Not difficult at all Somewhat difficult Not difficult at all     Data saved with a previous flowsheet row definition        12/02/2023    9:49 AM  Fall Risk   Falls in the past year? 0  Number falls in past yr: 0  Injury with Fall? 0  Risk for fall due to : No Fall Risks  Follow up Falls evaluation completed    Patient Care Team: Milon Cleaves,  PA as PCP - General (Physician Assistant) Lupe Charlie DEL, MD (Urology)   Review of Systems  Constitutional:  Negative for chills, fatigue and fever.  HENT:  Negative for congestion, ear pain, sinus pressure and sore throat.   Respiratory:  Negative for cough and shortness of breath.   Cardiovascular:  Negative for chest pain.  Gastrointestinal:  Negative for abdominal pain, constipation, diarrhea, nausea and vomiting.  Genitourinary:  Negative for dysuria and frequency.  Musculoskeletal:  Negative for arthralgias, back pain and myalgias.  Neurological:  Negative for dizziness and headaches.  Psychiatric/Behavioral:  Negative for dysphoric mood. The patient is not nervous/anxious.     Current Outpatient Medications on File Prior to Visit  Medication Sig Dispense  Refill   spironolactone  (ALDACTONE ) 25 MG tablet Take 0.5 tablets (12.5 mg total) by mouth daily. 30 tablet 1   No current facility-administered medications on file prior to visit.   Past Medical History:  Diagnosis Date   Allergy    Arthritis    Cancer (HCC) 2022   left renal cancer, had left nephrectomy   GERD (gastroesophageal reflux disease)    Heart murmur    Hyperlipidemia    Hypertension    Sleep apnea    not currently using cpap   Thyroid disease    Past Surgical History:  Procedure Laterality Date   CHOLECYSTECTOMY, LAPAROSCOPIC  11/03/2022   LAPAROTOMY  1980s   tubal pregnancy, left fallopian tube removal   NEPHRECTOMY Left     Family History  Problem Relation Age of Onset   Alzheimer's disease Mother    Diabetes Mellitus II Mother    Hypertension Father    Stroke Sister    Hypertension Sister    Breast cancer Neg Hx    Social History   Socioeconomic History   Marital status: Married    Spouse name: Rebekah Zackery   Number of children: 1   Years of education: Not on file   Highest education level: Not on file  Occupational History   Occupation: CONSERVATION OFFICER, [employer]   Occupation: Retired  Tobacco Use   Smoking status: Every Day    Current packs/day: 0.25    Average packs/day: 0.3 packs/day for 45.0 years (11.3 ttl pk-yrs)    Types: Cigarettes   Smokeless tobacco: Never  Vaping Use   Vaping status: Some Days  Substance and Sexual Activity   Alcohol use: Not Currently   Drug use: Never   Sexual activity: Yes    Partners: Male  Other Topics Concern   Not on file  Social History Narrative   Not on file   Social Drivers of Health   Financial Resource Strain: Low Risk  (07/16/2021)   Overall Financial Resource Strain (CARDIA)    Difficulty of Paying Living Expenses: Not hard at all  Food Insecurity: No Food Insecurity (07/16/2021)   Hunger Vital Sign    Worried About Running Out of Food in the Last Year: Never true    Ran Out of Food in the Last Year: Never  true  Transportation Needs: No Transportation Needs (07/16/2021)   PRAPARE - Administrator, Civil Service (Medical): No    Lack of Transportation (Non-Medical): No  Physical Activity: Sufficiently Active (07/16/2021)   Exercise Vital Sign    Days of Exercise per Week: 5 days    Minutes of Exercise per Session: 30 min  Stress: No Stress Concern Present (07/16/2021)   [employer] of Occupational Health - Occupational Stress Questionnaire    Feeling  of Stress : Not at all  Social Connections: Moderately Isolated (07/16/2021)   Social Connection and Isolation Panel    Frequency of Communication with Friends and Family: More than three times a week    Frequency of Social Gatherings with Friends and Family: More than three times a week    Attends Religious Services: Never    Database Administrator or Organizations: No    Attends Engineer, Structural: Never    Marital Status: Married    Objective:  BP 136/78   Pulse 69   Temp 97.8 F (36.6 C)   Ht 5' 5 (1.651 m)   Wt 230 lb 12.8 oz (104.7 kg)   LMP  (LMP Unknown)   SpO2 96%   BMI 38.41 kg/m      12/02/2023    9:47 AM 08/31/2023    9:10 AM 07/29/2023   10:37 AM  BP/Weight  Systolic BP 136 138 132  Diastolic BP 78 72 72  Wt. (Lbs) 230.8 227 226  BMI 38.41 kg/m2 37.77 kg/m2 37.61 kg/m2    Physical Exam Vitals and nursing note reviewed.  Constitutional:      Appearance: She is obese.  HENT:     Head: Normocephalic and atraumatic.  Eyes:     Pupils: Pupils are equal, round, and reactive to light.  Cardiovascular:     Rate and Rhythm: Normal rate and regular rhythm.  Pulmonary:     Effort: Pulmonary effort is normal.     Breath sounds: Normal breath sounds.  Musculoskeletal:        General: Normal range of motion.  Skin:    General: Skin is warm.  Neurological:     General: No focal deficit present.     Mental Status: She is alert.  Psychiatric:        Mood and Affect: Mood normal.          Lab Results  Component Value Date   WBC 7.9 08/31/2023   HGB 13.2 08/31/2023   HCT 41.3 08/31/2023   PLT 271 08/31/2023   GLUCOSE 93 08/31/2023   CHOL 173 08/31/2023   TRIG 206 (H) 08/31/2023   HDL 45 08/31/2023   LDLCALC 93 08/31/2023   ALT 24 08/31/2023   AST 26 08/31/2023   NA 138 08/31/2023   K 4.9 08/31/2023   CL 99 08/31/2023   CREATININE 1.45 (H) 08/31/2023   BUN 17 08/31/2023   CO2 25 08/31/2023   TSH 2.160 08/31/2023   HGBA1C 5.9 (H) 08/31/2023    Results for orders placed or performed in visit on 08/31/23  CBC with Differential/Platelet   Collection Time: 08/31/23  9:48 AM  Result Value Ref Range   WBC 7.9 3.4 - 10.8 x10E3/uL   RBC 4.24 3.77 - 5.28 x10E6/uL   Hemoglobin 13.2 11.1 - 15.9 g/dL   Hematocrit 58.6 65.9 - 46.6 %   MCV 97 79 - 97 fL   MCH 31.1 26.6 - 33.0 pg   MCHC 32.0 31.5 - 35.7 g/dL   RDW 87.4 88.2 - 84.5 %   Platelets 271 150 - 450 x10E3/uL   Neutrophils 57 Not Estab. %   Lymphs 33 Not Estab. %   Monocytes 8 Not Estab. %   Eos 1 Not Estab. %   Basos 1 Not Estab. %   Neutrophils Absolute 4.4 1.4 - 7.0 x10E3/uL   Lymphocytes Absolute 2.6 0.7 - 3.1 x10E3/uL   Monocytes Absolute 0.7 0.1 - 0.9 x10E3/uL   EOS (  ABSOLUTE) 0.1 0.0 - 0.4 x10E3/uL   Basophils Absolute 0.1 0.0 - 0.2 x10E3/uL   Immature Granulocytes 0 Not Estab. %   Immature Grans (Abs) 0.0 0.0 - 0.1 x10E3/uL  Comprehensive metabolic panel with GFR   Collection Time: 08/31/23  9:48 AM  Result Value Ref Range   Glucose 93 70 - 99 mg/dL   BUN 17 8 - 27 mg/dL   Creatinine, Ser 8.54 (H) 0.57 - 1.00 mg/dL   eGFR 40 (L) >40 fO/fpw/8.26   BUN/Creatinine Ratio 12 12 - 28   Sodium 138 134 - 144 mmol/L   Potassium 4.9 3.5 - 5.2 mmol/L   Chloride 99 96 - 106 mmol/L   CO2 25 20 - 29 mmol/L   Calcium 9.9 8.7 - 10.3 mg/dL   Total Protein 6.8 6.0 - 8.5 g/dL   Albumin 4.4 3.9 - 4.9 g/dL   Globulin, Total 2.4 1.5 - 4.5 g/dL   Bilirubin Total 0.4 0.0 - 1.2 mg/dL   Alkaline  Phosphatase 99 44 - 121 IU/L   AST 26 0 - 40 IU/L   ALT 24 0 - 32 IU/L  Hemoglobin A1c   Collection Time: 08/31/23  9:48 AM  Result Value Ref Range   Hgb A1c MFr Bld 5.9 (H) 4.8 - 5.6 %   Est. average glucose Bld gHb Est-mCnc 123 mg/dL  Lipid panel   Collection Time: 08/31/23  9:48 AM  Result Value Ref Range   Cholesterol, Total 173 100 - 199 mg/dL   Triglycerides 793 (H) 0 - 149 mg/dL   HDL 45 >60 mg/dL   VLDL Cholesterol Cal 35 5 - 40 mg/dL   LDL Chol Calc (NIH) 93 0 - 99 mg/dL   Chol/HDL Ratio 3.8 0.0 - 4.4 ratio  TSH   Collection Time: 08/31/23  9:48 AM  Result Value Ref Range   TSH 2.160 0.450 - 4.500 uIU/mL  .  Assessment & Plan:   Assessment & Plan Benign hypertension with stage 3a chronic kidney disease (HCC) Hypertensive chronic kidney disease Blood pressure is well-controlled at 136/78 mmHg with current medication regimen. - Continue amlodipine  5 mg daily, losartan  100 mg daily, spironolactone  25 mg daily. Orders:   amLODipine  (NORVASC ) 5 MG tablet; Take 1 tablet (5 mg total) by mouth daily.   losartan  (COZAAR ) 100 MG tablet; Take 1 tablet (100 mg total) by mouth daily.   Hemoglobin A1c   Comprehensive metabolic panel with GFR   CBC with Differential/Platelet  Acquired hypothyroidism Hypothyroidism Managed with levothyroxine  137 mcg daily. - Continue levothyroxine  137 mcg daily. - Ordered TSH level. Orders:   levothyroxine  (SYNTHROID ) 137 MCG tablet; Take 1 tablet (137 mcg total) by mouth daily before breakfast.   TSH   CBC with Differential/Platelet  Gastroesophageal reflux disease, unspecified whether esophagitis present Gastroesophageal reflux disease Managed with Prilosec 40 mg as needed, primarily after consuming certain foods like spaghetti. - Continue Prilosec 40 mg as needed. Orders:   omeprazole  (PRILOSEC) 40 MG capsule; Take 1 capsule (40 mg total) by mouth daily.   CBC with Differential/Platelet  Mixed hyperlipidemia Mixed  hyperlipidemia Managed with pravastatin  80 mg daily. - Continue pravastatin  80 mg daily. - Ordered lipid panel. Orders:   pravastatin  (PRAVACHOL ) 80 MG tablet; TAKE 1 TABLET BY MOUTH DAILY   Lipid panel   CBC with Differential/Platelet  Insomnia, unspecified type Insomnia Chronic insomnia managed with trazodone  150 mg nightly. - Continue trazodone  150 mg nightly. Orders:   traZODone  (DESYREL ) 150 MG tablet; Take 1 tablet (150  mg total) by mouth at bedtime.  Prediabetes Prediabetes Discussion about potential future use of Ozempic  or Wegovy once insurance is obtained. - Ordered A1c level.    Dysuria Dysuria Urinalysis shows 15 white blood cells, but no symptoms of infection. Differential includes possible contamination or early infection. - Ordered urine culture. - Advised to maintain adequate hydration.      History of left kidney cancer, status post nephrectomy Left nephrectomy performed approximately five years ago. - Plan to consult with a nephrologist once insurance is obtained.  Tobacco use Current smoker with reduced consumption to 1-4 cigarettes per day. Previous smoking history of half a pack per day. - Plan for lung cancer screening CT scan once insurance is obtained.  General Health Maintenance Discussion about upcoming screenings and preventive health measures. - Plan for mammogram and colon cancer screening once insurance is obtained. - Plan for Pap smear once insurance is obtained.        Body mass index is 38.41 kg/m.  Assessment and Plan      Meds ordered this encounter  Medications   amLODipine  (NORVASC ) 5 MG tablet    Sig: Take 1 tablet (5 mg total) by mouth daily.    Dispense:  90 tablet    Refill:  0   levothyroxine  (SYNTHROID ) 137 MCG tablet    Sig: Take 1 tablet (137 mcg total) by mouth daily before breakfast.    Dispense:  90 tablet    Refill:  0   losartan  (COZAAR ) 100 MG tablet    Sig: Take 1 tablet (100 mg total) by mouth daily.     Dispense:  90 tablet    Refill:  0    Please send a replace/new response with 90-Day Supply if appropriate to maximize member benefit. Requesting 1 year supply.   omeprazole  (PRILOSEC) 40 MG capsule    Sig: Take 1 capsule (40 mg total) by mouth daily.    Dispense:  90 capsule    Refill:  0    Requesting 1 year supply   pravastatin  (PRAVACHOL ) 80 MG tablet    Sig: TAKE 1 TABLET BY MOUTH DAILY    Dispense:  90 tablet    Refill:  0    Please send a replace/new response with 90-Day Supply if appropriate to maximize member benefit. Requesting 1 year supply.   traZODone  (DESYREL ) 150 MG tablet    Sig: Take 1 tablet (150 mg total) by mouth at bedtime.    Dispense:  90 tablet    Refill:  0    Orders Placed This Encounter  Procedures   TSH   Lipid panel   Hemoglobin A1c   Comprehensive metabolic panel with GFR   CBC with Differential/Platelet       Follow-up: No follow-ups on file.  An After Visit Summary was printed and given to the patient.  Garima Chronis, MD [facility] [phone]

## 2023-12-02 NOTE — Assessment & Plan Note (Addendum)
 Mixed hyperlipidemia Managed with pravastatin  80 mg daily. - Continue pravastatin  80 mg daily. - Ordered lipid panel. Orders:   pravastatin  (PRAVACHOL ) 80 MG tablet; TAKE 1 TABLET BY MOUTH DAILY   Lipid panel   CBC with Differential/Platelet

## 2023-12-03 LAB — CBC WITH DIFFERENTIAL/PLATELET
Basophils Absolute: 0.1 x10E3/uL (ref 0.0–0.2)
Basos: 1 %
EOS (ABSOLUTE): 0.1 x10E3/uL (ref 0.0–0.4)
Eos: 1 %
Hematocrit: 39.2 % (ref 34.0–46.6)
Hemoglobin: 12.2 g/dL (ref 11.1–15.9)
Immature Grans (Abs): 0 x10E3/uL (ref 0.0–0.1)
Immature Granulocytes: 0 %
Lymphocytes Absolute: 2.2 x10E3/uL (ref 0.7–3.1)
Lymphs: 41 %
MCH: 30.5 pg (ref 26.6–33.0)
MCHC: 31.1 g/dL — ABNORMAL LOW (ref 31.5–35.7)
MCV: 98 fL — ABNORMAL HIGH (ref 79–97)
Monocytes Absolute: 0.4 x10E3/uL (ref 0.1–0.9)
Monocytes: 8 %
Neutrophils Absolute: 2.6 x10E3/uL (ref 1.4–7.0)
Neutrophils: 49 %
Platelets: 270 x10E3/uL (ref 150–450)
RBC: 4 x10E6/uL (ref 3.77–5.28)
RDW: 12.6 % (ref 11.7–15.4)
WBC: 5.4 x10E3/uL (ref 3.4–10.8)

## 2023-12-03 LAB — COMPREHENSIVE METABOLIC PANEL WITH GFR
ALT: 19 IU/L (ref 0–32)
AST: 20 IU/L (ref 0–40)
Albumin: 4.2 g/dL (ref 3.9–4.9)
Alkaline Phosphatase: 98 IU/L (ref 49–135)
BUN/Creatinine Ratio: 14 (ref 12–28)
BUN: 17 mg/dL (ref 8–27)
Bilirubin Total: 0.4 mg/dL (ref 0.0–1.2)
CO2: 22 mmol/L (ref 20–29)
Calcium: 9.6 mg/dL (ref 8.7–10.3)
Chloride: 101 mmol/L (ref 96–106)
Creatinine, Ser: 1.22 mg/dL — ABNORMAL HIGH (ref 0.57–1.00)
Globulin, Total: 2.3 g/dL (ref 1.5–4.5)
Glucose: 119 mg/dL — ABNORMAL HIGH (ref 70–99)
Potassium: 4.5 mmol/L (ref 3.5–5.2)
Sodium: 137 mmol/L (ref 134–144)
Total Protein: 6.5 g/dL (ref 6.0–8.5)
eGFR: 50 mL/min/1.73 — ABNORMAL LOW (ref >59)

## 2023-12-03 LAB — LIPID PANEL
Chol/HDL Ratio: 3.2 ratio (ref 0.0–4.4)
Cholesterol, Total: 155 mg/dL (ref 100–199)
HDL: 49 mg/dL (ref >39)
LDL Chol Calc (NIH): 81 mg/dL (ref 0–99)
Triglycerides: 141 mg/dL (ref 0–149)
VLDL Cholesterol Cal: 25 mg/dL (ref 5–40)

## 2023-12-03 LAB — HEMOGLOBIN A1C
Est. average glucose Bld gHb Est-mCnc: 128 mg/dL
Hgb A1c MFr Bld: 6.1 % — ABNORMAL HIGH (ref 4.8–5.6)

## 2023-12-03 LAB — TSH: TSH: 1.74 u[IU]/mL (ref 0.450–4.500)

## 2023-12-04 LAB — URINE CULTURE

## 2023-12-05 ENCOUNTER — Ambulatory Visit: Payer: Self-pay

## 2023-12-05 NOTE — Progress Notes (Signed)
 Can you please inform the patient that her blood work showed BORDERLINE DIABETES AT 6.1, which has gotten worse slightly. Kidney function has improved. Normal liver numbers. Normal blood counts, thyroid function and cholesterol numbers.  Urine culture showed BACTERIA beta hemolytic streptococcus. As per guidelines, she does not need to be treated, as she did not have any symptoms. BUT if she insists on treatment, then, please SEND AMOXICILLIN  500 MG TWICE DAILY FOR 7 DAYS.  Thanks, Lukasz Rogus MD

## 2024-01-31 ENCOUNTER — Encounter: Admitting: Physician Assistant

## 2024-03-07 ENCOUNTER — Ambulatory Visit: Admitting: Physician Assistant
